# Patient Record
Sex: Male | Born: 1980
Health system: Southern US, Community
[De-identification: ages and names within clinical notes are randomized; demographics above are authoritative.]

## PROBLEM LIST (undated history)

## (undated) DIAGNOSIS — T7840XA Allergy, unspecified, initial encounter: Secondary | ICD-10-CM

## (undated) HISTORY — DX: Allergy, unspecified, initial encounter: T78.40XA

## (undated) HISTORY — PX: WISDOM TOOTH EXTRACTION: SHX21

---

## 2004-11-13 HISTORY — PX: POPLITEAL SYNOVIAL CYST EXCISION: SUR555

## 2006-08-28 ENCOUNTER — Emergency Department (HOSPITAL_COMMUNITY): Admission: EM | Admit: 2006-08-28 | Discharge: 2006-08-28 | Payer: Self-pay | Admitting: Emergency Medicine

## 2007-08-11 IMAGING — CR DG ABDOMEN 1V
2 series · 2 of 2 positions shown · non-contrast
Comparison: none

CLINICAL DATA: Abdomen and back pain.  Nausea and vomiting.  Constipation. 
 ABDOMEN ? 1 VIEW (2 IMAGES):

[view not recorded (1 of 2)]
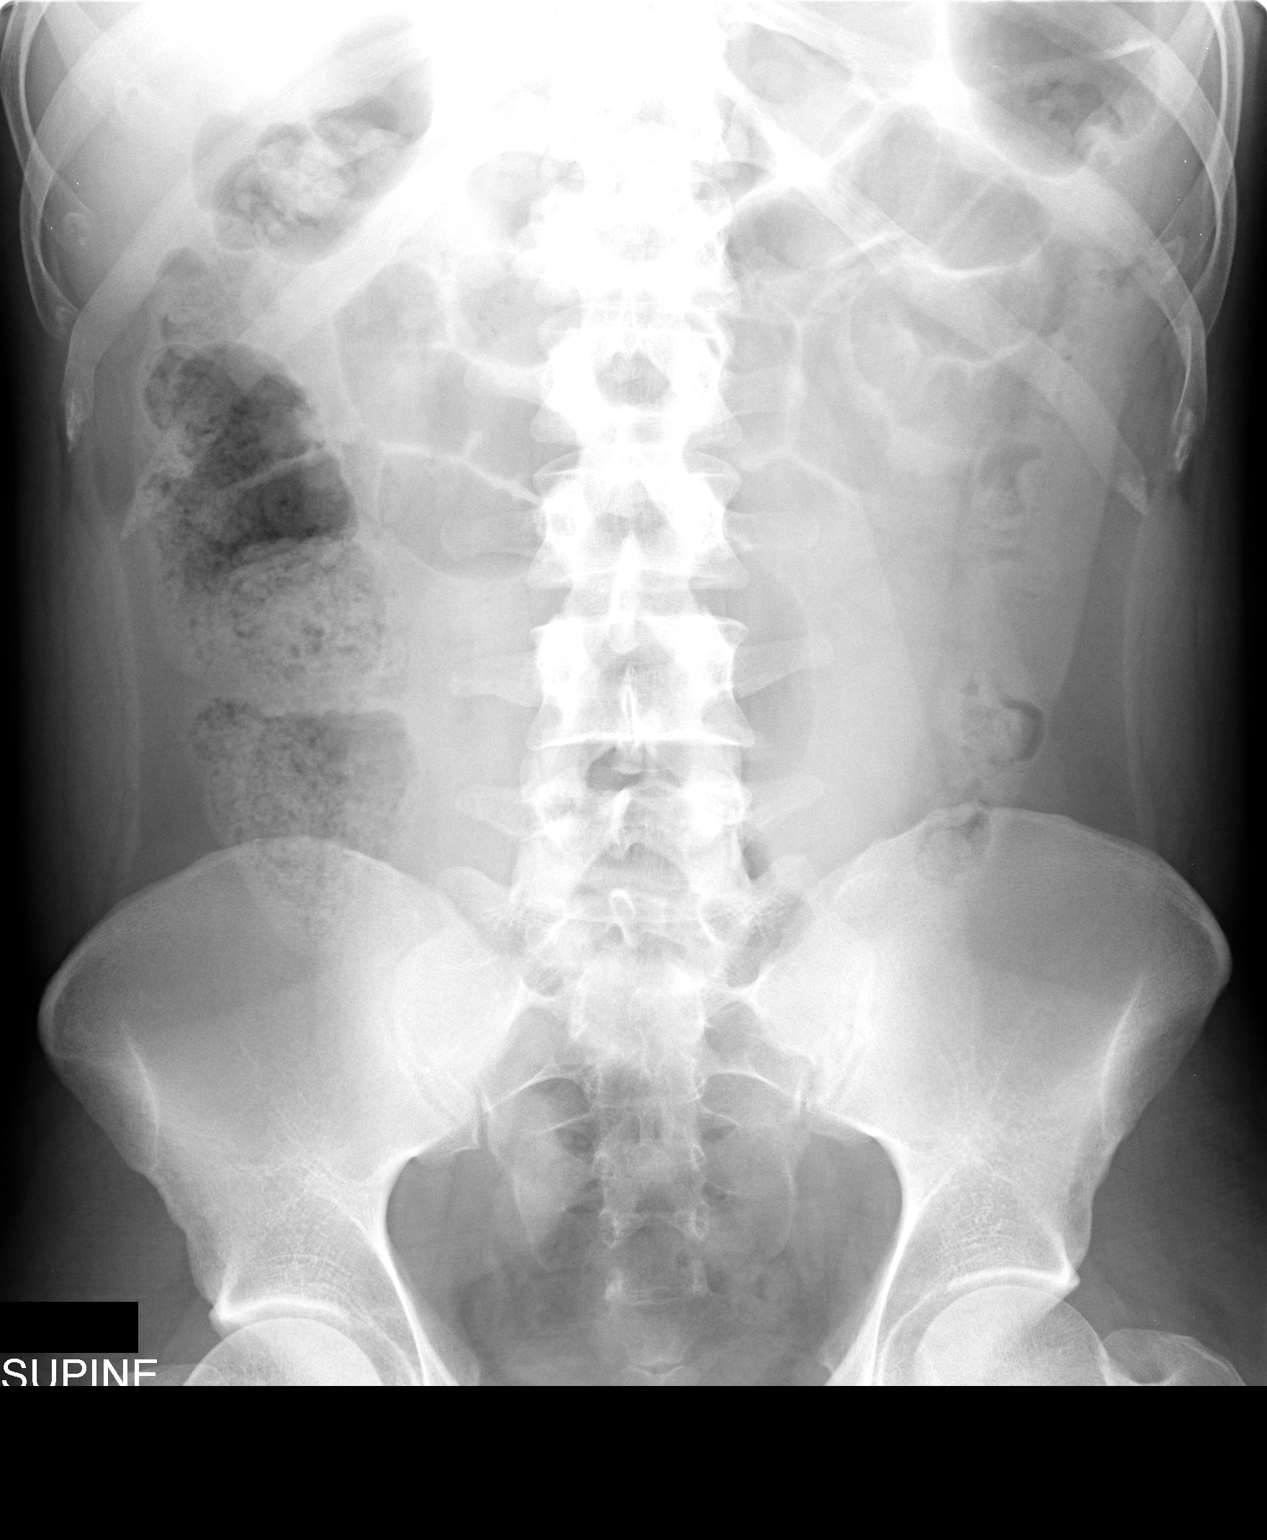

[view not recorded (2 of 2)]
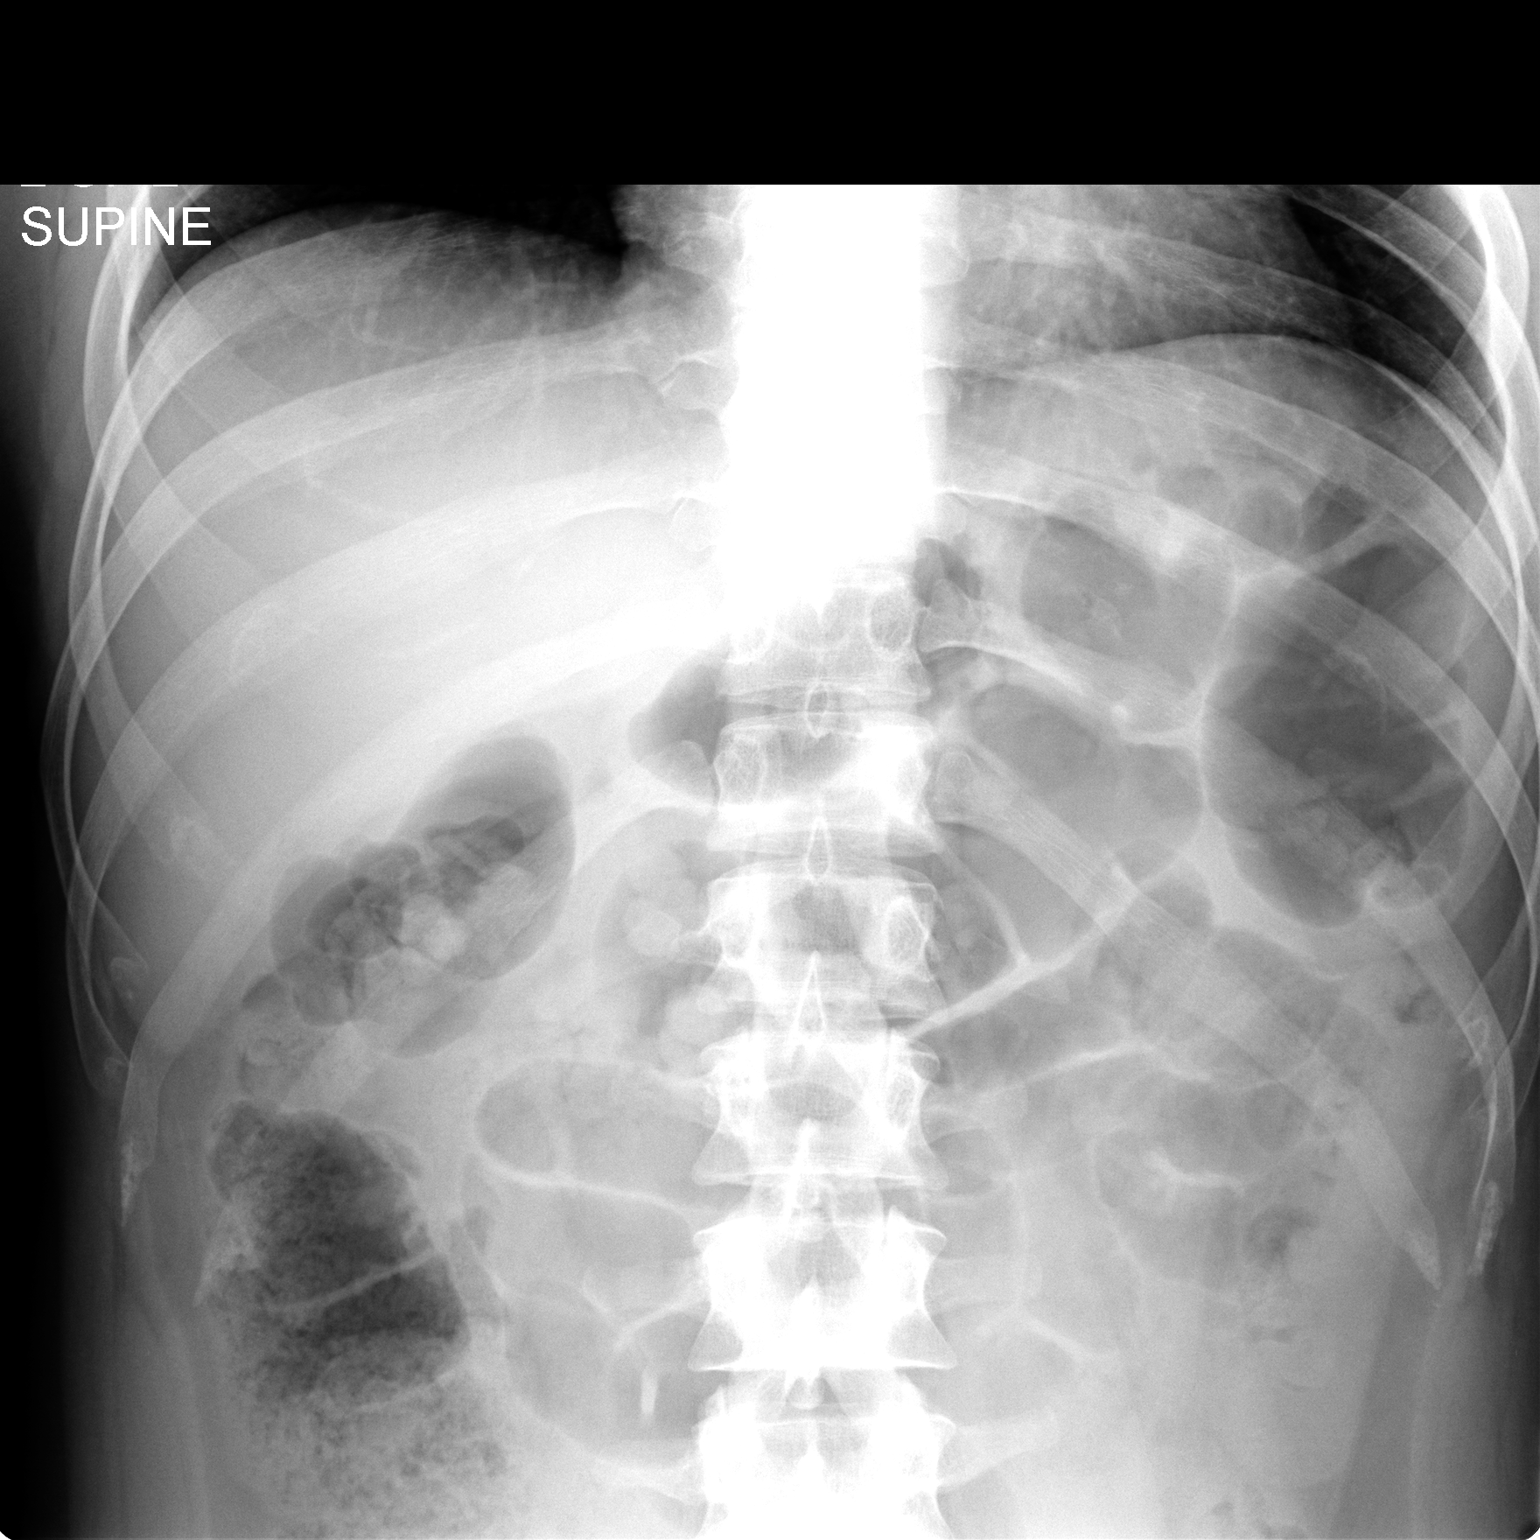

[2 of 2 positions shown; findings below may reference images not displayed]

FINDINGS: No free air.  There is no evidence of bowel obstruction with a moderate volume of stool present in the right colon.   No unexpected abdominal calcifications.
IMPRESSION: Moderate right colonic constipation.  Otherwise negative.

## 2020-07-21 ENCOUNTER — Other Ambulatory Visit: Payer: Self-pay

## 2020-07-21 ENCOUNTER — Encounter: Payer: Self-pay | Admitting: Physician Assistant

## 2020-07-21 ENCOUNTER — Ambulatory Visit (INDEPENDENT_AMBULATORY_CARE_PROVIDER_SITE_OTHER): Payer: 59 | Admitting: Physician Assistant

## 2020-07-21 VITALS — BP 120/86 | HR 86 | Temp 98.8°F | Ht 67.0 in | Wt 242.0 lb

## 2020-07-21 DIAGNOSIS — Z803 Family history of malignant neoplasm of breast: Secondary | ICD-10-CM

## 2020-07-21 DIAGNOSIS — K625 Hemorrhage of anus and rectum: Secondary | ICD-10-CM | POA: Diagnosis not present

## 2020-07-21 DIAGNOSIS — R41840 Attention and concentration deficit: Secondary | ICD-10-CM | POA: Diagnosis not present

## 2020-07-21 DIAGNOSIS — Z800 Family history of malignant neoplasm of digestive organs: Secondary | ICD-10-CM | POA: Diagnosis not present

## 2020-07-21 NOTE — Progress Notes (Signed)
David Meadows is a 39 y.o. male is here to establish care.  I acted as a Education administrator for [employer], PA-C Anselmo Pickler, LPN   History of Present Illness:   Chief Complaint  Patient presents with  . Establish Care  . ADHD  . Hemorrhoids  . Shoulder Pain    HPI   Pt is here today to establish care.  Pancreatic cancer and breast cancer family history Father with history of pancreatic cancer. Paternal grandmother with history of breast cancer. He would like cancer screening.  Hemorrhoids Pt is c/o hemorrhoids off and on for years. Spotting off and on, using preparation H wipes and cream. Has not noticed any blood in his stool. Blood is bright red when wiping. Has itching. Does have history of constipation, but denies any recently. Goes about 1-2 times a day. Will get "gas bubbles" 1-2 times per day (but not daily) and was in the lower abdominal area. Most recently happened a few weeks ago.  ADHD Pt was diagnosed as a child (elementary school aged -- possibly first and second grade) and was never on medication.  Was recommended to start medications but his mom was against this and he was therefore never put on medication. Pt would like to discuss at this time due to having trouble focusing, concentrating. He reports that working at home has caused him to realize that he is having issues with ADHD. Denies issues with anxiety/depression.   Health Maintenance Due  Topic Date Due  . Hepatitis C Screening  Never done  . HIV Screening  Never done  . TETANUS/TDAP  Never done  . INFLUENZA VACCINE  Never done    Past Medical History:  Diagnosis Date  . Allergy      Social History   Tobacco Use  . Smoking status: Former Smoker    Types: Cigarettes  . Smokeless tobacco: Never Used  . Tobacco comment: quit 2013  Vaping Use  . Vaping Use: Never used  Substance Use Topics  . Alcohol use: Yes    Comment: 1-2 per month  . Drug use: Never    Past Surgical History:    Procedure Laterality Date  . WISDOM TOOTH EXTRACTION      Family History  Problem Relation Age of Onset  . Other Mother        heart arrythmia  . Alcohol abuse Father   . Pancreatic cancer Father   . Heart murmur Father   . Drug abuse Sister   . Cataracts Maternal Grandmother   . Memory loss Maternal Grandmother   . Breast cancer Paternal Grandmother   . ADD / ADHD Daughter   . Colon polyps Paternal Uncle   . Colon cancer Neg Hx     PMHx, SurgHx, SocialHx, FamHx, Medications, and Allergies were reviewed in the Visit Navigator and updated as appropriate.   There are no problems to display for this patient.   Social History   Tobacco Use  . Smoking status: Former Smoker    Types: Cigarettes  . Smokeless tobacco: Never Used  . Tobacco comment: quit 2013  Vaping Use  . Vaping Use: Never used  Substance Use Topics  . Alcohol use: Yes    Comment: 1-2 per month  . Drug use: Never    Current Medications and Allergies:    Current Outpatient Medications:  .  ibuprofen (ADVIL) 200 MG tablet, Take 600 mg by mouth as needed., Disp: , Rfl:  .  loratadine (CLARITIN) 10 MG tablet,  Take 10 mg by mouth daily., Disp: , Rfl:  .  OVER THE COUNTER MEDICATION, Take 2 capsules by mouth daily. [place] Mega Men Energy & Metabolism, Disp: , Rfl:    Allergies  Allergen Reactions  . Other     Mild wheat allergy per patient    Review of Systems   ROS  Negative unless otherwise specified per HPI.  Vitals:   Vitals:   07/21/20 1145  BP: 120/86  Pulse: 86  Temp: 98.8 F (37.1 C)  TempSrc: Temporal  SpO2: 97%  Weight: 242 lb (109.8 kg)  Height: 5\' 7"  (1.702 m)     Body mass index is 37.9 kg/m.   Physical Exam:    Physical Exam Vitals and nursing note reviewed.  Constitutional:      General: He is not in acute distress.    Appearance: He is well-developed. He is not ill-appearing or toxic-appearing.  Cardiovascular:     Rate and Rhythm: Normal rate and regular rhythm.      Pulses: Normal pulses.     Heart sounds: Normal heart sounds, S1 normal and S2 normal.     Comments: No LE edema Pulmonary:     Effort: Pulmonary effort is normal.     Breath sounds: Normal breath sounds.  Abdominal:     General: Abdomen is flat. Bowel sounds are normal.     Palpations: Abdomen is soft.     Tenderness: There is no abdominal tenderness.  Skin:    General: Skin is warm and dry.  Neurological:     Mental Status: He is alert.     GCS: GCS eye subscore is 4. GCS verbal subscore is 5. GCS motor subscore is 6.  Psychiatric:        Speech: Speech normal.        Behavior: Behavior normal. Behavior is cooperative.      Assessment and Plan:    Quinlan was seen today for establish care, adhd, hemorrhoids and shoulder pain.  Diagnoses and all orders for this visit:  Family history of breast cancer; Family history of pancreatic cancer Referral to genetics counselor.  Rectal bleeding No red flags on exam. Offered anusol suppositories however he declined. Referral to GI placed today.  Attention deficit Referral to [facility].   Recommended returning for CPE at his convenience.   CMA or LPN served as scribe during this visit. History, Physical, and Plan performed by medical provider. The above documentation has been reviewed and is accurate and complete.  Inda Coke, PA-C [place] [place] 07/21/2020  Follow-up: No follow-ups on file.

## 2020-07-21 NOTE — Addendum Note (Signed)
Addended by: Erlene Quan on: 07/21/2020 12:47 PM   Modules accepted: Orders

## 2020-07-21 NOTE — Patient Instructions (Signed)
It was great to see you!  You will be contacted about your referrals to: [facility] [place] [facility]  Let's follow-up at your convenience for a physical, sooner if you have concerns.  Take care,  Inda Coke PA-C

## 2020-07-22 ENCOUNTER — Telehealth: Payer: Self-pay | Admitting: Genetic Counselor

## 2020-07-22 NOTE — Telephone Encounter (Signed)
Received a genetic counseling referral from David Meadows, [place] for fhx of breast and pancreatic cancer. David Meadows has been cld and scheduled to see Cari on 10/4 at 11am. Pt aware to arrive 15 minutes early.

## 2020-08-16 ENCOUNTER — Inpatient Hospital Stay: Payer: 59

## 2020-08-16 ENCOUNTER — Inpatient Hospital Stay: Payer: 59 | Attending: Genetic Counselor | Admitting: Genetic Counselor

## 2020-08-16 ENCOUNTER — Other Ambulatory Visit: Payer: Self-pay

## 2020-08-16 DIAGNOSIS — Z800 Family history of malignant neoplasm of digestive organs: Secondary | ICD-10-CM | POA: Diagnosis not present

## 2020-08-16 DIAGNOSIS — Z8371 Family history of colonic polyps: Secondary | ICD-10-CM | POA: Diagnosis not present

## 2020-08-16 DIAGNOSIS — Z803 Family history of malignant neoplasm of breast: Secondary | ICD-10-CM

## 2020-08-17 ENCOUNTER — Encounter: Payer: Self-pay | Admitting: Genetic Counselor

## 2020-08-17 DIAGNOSIS — Z800 Family history of malignant neoplasm of digestive organs: Secondary | ICD-10-CM | POA: Insufficient documentation

## 2020-08-17 DIAGNOSIS — Z8371 Family history of colonic polyps: Secondary | ICD-10-CM

## 2020-08-17 DIAGNOSIS — Z83719 Family history of colon polyps, unspecified: Secondary | ICD-10-CM

## 2020-08-17 DIAGNOSIS — Z803 Family history of malignant neoplasm of breast: Secondary | ICD-10-CM

## 2020-08-17 HISTORY — DX: Family history of colonic polyps: Z83.71

## 2020-08-17 HISTORY — DX: Family history of colon polyps, unspecified: Z83.719

## 2020-08-17 HISTORY — DX: Family history of malignant neoplasm of breast: Z80.3

## 2020-08-17 NOTE — Progress Notes (Signed)
EFERRING PROVIDER: Inda Coke, [address] [place],  [place] [address]  PRIMARY PROVIDER:  Inda Coke, [place]  PRIMARY REASON FOR VISIT:  1. Family history of colon cancer   2. Family history of breast cancer    HISTORY OF PRESENT ILLNESS:   Mr. Murrell, a 39 y.o. male, was seen for a [facility] cancer genetics consultation at the request of Dr. Morene Rankins due to a family history of signet ring adenocarcinoma and breast cancer.  Mr. Spraggins presents to clinic today to discuss the possibility of a hereditary predisposition to cancer, to discuss genetic testing, and to further clarify his future cancer risks, as well as potential cancer risks for family members.   Mr. Eisenbeis is a 39 y.o. male with no personal history of cancer.    RISK FACTORS:  Colonoscopy: no; not examined.  Prostate cancer screening: none reported   Past Medical History:  Diagnosis Date  . Allergy   . Family history of breast cancer 08/17/2020    Past Surgical History:  Procedure Laterality Date  . WISDOM TOOTH EXTRACTION      Social History   Socioeconomic History  . Marital status: Married    Spouse name: Not on file  . Number of children: Not on file  . Years of education: Not on file  . Highest education level: Not on file  Occupational History  . Not on file  Tobacco Use  . Smoking status: Former Smoker    Types: Cigarettes  . Smokeless tobacco: Never Used  . Tobacco comment: quit 2013  Vaping Use  . Vaping Use: Never used  Substance and Sexual Activity  . Alcohol use: Yes    Comment: 1-2 per month  . Drug use: Never  . Sexual activity: Yes  Other Topics Concern  . Not on file  Social History Narrative   Works from home   Was in [employer] with girlfriend   Daughter is 74 yo (2021)   From [employer]   Social Determinants of Health   Financial Resource Strain:   . Difficulty of Paying Living Expenses: Not on file  Food Insecurity:   . Worried About Charity fundraiser  in the Last Year: Not on file  . Ran Out of Food in the Last Year: Not on file  Transportation Needs:   . Lack of Transportation (Medical): Not on file  . Lack of Transportation (Non-Medical): Not on file  Physical Activity:   . Days of Exercise per Week: Not on file  . Minutes of Exercise per Session: Not on file  Stress:   . Feeling of Stress : Not on file  Social Connections:   . Frequency of Communication with Friends and Family: Not on file  . Frequency of Social Gatherings with Friends and Family: Not on file  . Attends Religious Services: Not on file  . Active Member of Clubs or Organizations: Not on file  . Attends Archivist Meetings: Not on file  . Marital Status: Not on file     FAMILY HISTORY:  We obtained a detailed, 4-generation family history.  Significant diagnoses are listed below: Family History  Problem Relation Age of Onset  . Cancer Father        signet ring adenocarcinoma, unknown primary (colon? appendix?), dx mid 75s  . Breast cancer Paternal Grandmother        dx after 74  . Colon polyps Paternal Uncle       Mr.  Barret has one brother, age 85, and a paternal half sister in her 24s. Neither of his siblings have a cancer history.  Mr. Spieler mother is 48 years old.  No maternal family history of cancer was reported.  Mr. Kneisel father was diagnosed in his mid 86s with signet ring adenocarcinoma of unknown primary.  Mr. Venn mentioned that this was likely colon or appendix cancer.  Mr. Schaumburg paternal uncle was found to have have multiple colon polyps; however, Mr. Costabile could not recall the number of polyps.  Mr. Adamcik paternal grandmother was diagnosed with breast cancer after the age of 19 and passed away in her 87s or 28s.  No other paternal family history of cancer was reported.   Mr. Lipari is unaware of previous family history of genetic testing for hereditary cancer risks. Patient's maternal ancestors are of [place] and Native  American descent, and paternal ancestors are of [place] and [place] descent. There is no reported Ashkenazi Jewish ancestry. There is no known consanguinity.  GENETIC COUNSELING ASSESSMENT: Mr. Parmer is a 39 y.o. male with a family history of cancer which is somewhat suggestive of a hereditary cancer syndrome and predisposition to cancer given the age at which his father was diagnosed, the signet ring pathology of his father's cancer, and the presence of related cancers in the family. We, therefore, discussed and recommended the following at today's visit.   DISCUSSION: We discussed that 5 - 10% of cancer is hereditary. We discussed that cancer of the digestive system with signet ring pathology can be associated with mutations in CDH1, which is also associated with lobular breast cancer.  There are also several hereditary cancer syndromes associated with colon cancer before the age of 51, including but not limited to Lynch syndrome. We discussed that testing is beneficial for several reasons, including knowing about other cancer risks, identifying potential screening and risk-reduction options that may be appropriate, and understanding if other family members could be at risk for cancer and allowing them to undergo genetic testing.  We reviewed the characteristics, features and inheritance patterns of hereditary cancer syndromes. We also discussed genetic testing, including the appropriate family members to test, the process of testing, insurance coverage and turn-around-time for results. We discussed the implications of a negative, positive, carrier and/or variant of uncertain significant result. We discussed that negative results would be uninformative given that Mr. Grigorian does not have a personal history of cancer. We recommended Mr. Clubb pursue genetic testing for a panel that contains genes associated with cancers of the digestive system and breast cancer.  Mr. Fournet was offered a common hereditary  cancer panel (48 genes) and an expanded pan-cancer panel (85 genes). Mr. Louque was informed of the benefits and limitations of each panel, including that expanded pan-cancer panels contain several preliminary evidence genes that do not have clear management guidelines at this point in time.  We also discussed that as the number of genes included on a panel increases, the chances of variants of uncertain significance increases.  After considering the benefits and limitations of each gene panel, Mr. Pankow elected to have an expanded pan-cancer panel through [facility].  The Multi-Cancer Panel offered by [facility] includes sequencing and/or deletion duplication testing of the following 85 genes: AIP, ALK, APC, ATM, AXIN2,BAP1,  BARD1, BLM, BMPR1A, BRCA1, BRCA2, BRIP1, CASR, CDC73, CDH1, CDK4, CDKN1B, CDKN1C, CDKN2A (p14ARF), CDKN2A (p16INK4a), CEBPA, CHEK2, CTNNA1, DICER1, DIS3L2, EGFR (c.2369C>T, p.Thr790Met variant only), EPCAM (Deletion/duplication testing only), FH, FLCN, GATA2, GPC3, GREM1 (Promoter region  deletion/duplication testing only), HOXB13 (c.251G>A, p.Gly84Glu), HRAS, KIT, MAX, MEN1, MET, MITF (c.952G>A, p.Glu318Lys variant only), MLH1, MSH2, MSH3, MSH6, MUTYH, NBN, NF1, NF2, NTHL1, PALB2, PDGFRA, PHOX2B, PMS2, POLD1, POLE, POT1, PRKAR1A, PTCH1, PTEN, RAD50, RAD51C, RAD51D, RB1, RECQL4, RET, RNF43, RUNX1, SDHAF2, SDHA (sequence changes only), SDHB, SDHC, SDHD, SMAD4, SMARCA4, SMARCB1, SMARCE1, STK11, SUFU, TERC, TERT, TMEM127, TP53, TSC1, TSC2, VHL, WRN and WT1.    Based on Mr. Iracheta's family history of cancer, he meets medical criteria for genetic testing. Despite that he meets criteria, he may still have an out of pocket cost. We discussed that if his out of pocket cost for testing is over $100, the laboratory will call and confirm whether he wants to proceed with testing.  If the out of pocket cost of testing is less than $100 he will be billed by the genetic testing laboratory.   We discussed  that some people do not want to undergo genetic testing due to fear of genetic discrimination.  A federal law called the Genetic Information Non-Discrimination Act (GINA) of 2008 helps protect individuals against genetic discrimination based on their genetic test results.  It impacts both health insurance and employment.  With health insurance, it protects against increased premiums, being kicked off insurance or being forced to take a test in order to be insured.  For employment it protects against hiring, firing and promoting decisions based on genetic test results.  GINA does not apply to those in the [employer], those who work for companies with less than 15 employees, and new life insurance or long-term disability insurance policies.  Health status due to a cancer diagnosis is not protected under GINA.  PLAN: After considering the risks, benefits, and limitations, Mr. Bunte provided informed consent to pursue genetic testing and the blood sample was sent to [facility] for analysis of the Multi-Cancer Panel. Results should be available within approximately 3 weeks' time, at which point they will be disclosed by telephone to Mr. Bergen, as will any additional recommendations warranted by these results. Mr. Stryker will receive a summary of his genetic counseling visit and a copy of his results once available. This information will also be available in Epic.   Lastly, we encouraged Mr. Resende to remain in contact with cancer genetics annually so that we can continuously update the family history and inform him of any changes in cancer genetics and testing that may be of benefit for this family.   Mr. Kray questions were answered to his satisfaction today. Our contact information was provided should additional questions or concerns arise. Thank you for the referral and allowing [place] to share in the care of your patient.   Imelda Dandridge M. Joette Catching, [place], [facility] Certified [email or web address] (P) [phone]   The patient was seen for a total of 35 minutes in face-to-face genetic counseling.  This patient was discussed with Drs. Magrinat, Lindi Adie and/or Burr Medico who agrees with the above.    _______________________________________________________________________ For Office Staff:  Number of people involved in session: 1 Was an Intern/ student involved with case: no

## 2020-08-25 ENCOUNTER — Encounter: Payer: 59 | Admitting: Physician Assistant

## 2020-08-25 ENCOUNTER — Telehealth: Payer: Self-pay | Admitting: Genetic Counselor

## 2020-08-25 NOTE — Telephone Encounter (Signed)
Revealed negative genetic testing.  Discussed that we do not know why there is cancer in the family. It could be sporadic, due to a mutation that he did not inherit, or due to a different gene that we are not testing.  It will be important for him to keep in contact with genetics to keep up with whether additional testing may be needed.

## 2020-08-30 ENCOUNTER — Ambulatory Visit: Payer: 59 | Admitting: Physician Assistant

## 2020-08-30 ENCOUNTER — Encounter: Payer: Self-pay | Admitting: Physician Assistant

## 2020-08-30 ENCOUNTER — Encounter: Payer: Self-pay | Admitting: Genetic Counselor

## 2020-08-30 ENCOUNTER — Ambulatory Visit: Payer: Self-pay | Admitting: Genetic Counselor

## 2020-08-30 VITALS — BP 114/84 | HR 84 | Ht 67.0 in | Wt 239.8 lb

## 2020-08-30 DIAGNOSIS — Z1379 Encounter for other screening for genetic and chromosomal anomalies: Secondary | ICD-10-CM

## 2020-08-30 DIAGNOSIS — Z8371 Family history of colonic polyps: Secondary | ICD-10-CM

## 2020-08-30 DIAGNOSIS — Z800 Family history of malignant neoplasm of digestive organs: Secondary | ICD-10-CM

## 2020-08-30 DIAGNOSIS — K625 Hemorrhage of anus and rectum: Secondary | ICD-10-CM

## 2020-08-30 DIAGNOSIS — Z803 Family history of malignant neoplasm of breast: Secondary | ICD-10-CM

## 2020-08-30 MED ORDER — NA SULFATE-K SULFATE-MG SULF 17.5-3.13-1.6 GM/177ML PO SOLN: 1.0000 | Freq: Once | ORAL | 0 refills | Status: AC

## 2020-08-30 NOTE — Patient Instructions (Signed)
If you are age 37 or older, your body mass index should be between 23-30. Your Body mass index is 37.56 kg/m. If this is out of the aforementioned range listed, please consider follow up with your Primary Care Provider.  If you are age 39 or younger, your body mass index should be between 19-25. Your Body mass index is 37.56 kg/m. If this is out of the aformentioned range listed, please consider follow up with your Primary Care Provider.   You have been scheduled for a colonoscopy. Please follow written instructions given to you at your visit today.  Please pick up your prep supplies at the pharmacy within the next 1-3 days. If you use inhalers (even only as needed), please bring them with you on the day of your procedure.  Follow up pending on the results of your Colonoscopy.

## 2020-08-30 NOTE — Progress Notes (Signed)
Subjective:    Patient ID: David Meadows, male    DOB: Aug 19, 1981, 39 y.o.   MRN: [id number]  HPI Sion is a pleasant 39 year old white male, new to GI today referred by Inda Coke, PA-C for colon cancer screening. Patient's father had history of a signet ring adenocarcinoma diagnosed at age 7, apparently was not clear whether this tumor originated from the cecum or the appendix.  He also has family history of breast cancer.  He believes that his paternal uncle also had colon polyps. Patient underwent genetic counseling recently due to concerns for a hereditary cancer syndrome He had a multicancer panel done and results were negative. Patient otherwise in generally good health, with history of ADHD.  He relates that he has had intermittent small-volume rectal bleeding for several years which she has attributed to hemorrhoids.  This is generally associated with some itching and burning.  He may have symptoms less than once per month.  He has noticed spotting of blood on the tissue very sporadically.  He has used Preparation H and witch hazel as needed. he has not had any changes in his bowel habits, says occasionally he will have more frequent stools and occasionally has some abdominal cramping or gas. No chronic GERD symptoms, no dysphagia or odynophagia.  Review of Systems Pertinent positive and negative review of systems were noted in the above HPI section.  All other review of systems was otherwise negative.  Outpatient Encounter Medications as of 08/30/2020  Medication Sig  . ibuprofen (ADVIL) 200 MG tablet Take 600 mg by mouth as needed.  . loratadine (CLARITIN) 10 MG tablet Take 10 mg by mouth daily.  Mar[place] Kitchen OVER THE COUNTER MEDICATION Take 2 capsules by mouth daily. [place] Mega Men Energy & Metabolism  . Na Sulfate-K Sulfate-Mg Sulf 17.5-3.13-1.6 GM/177ML SOLN Take 1 kit by mouth once for 1 dose. Apply Coupon=BIN: [id number] PCN: CN GROUP: [id number] ID: [id number]   No  facility-administered encounter medications on file as of 08/30/2020.   Allergies  Allergen Reactions  . Other     Mild wheat allergy per patient   Patient Active Problem List   Diagnosis Date Noted  . Genetic testing 08/30/2020  . Family history of colon cancer 08/17/2020  . Family history of breast cancer 08/17/2020  . Family history of colonic polyps 08/17/2020   Social History   Socioeconomic History  . Marital status: Married    Spouse name: Not on file  . Number of children: Not on file  . Years of education: Not on file  . Highest education level: Not on file  Occupational History  . Not on file  Tobacco Use  . Smoking status: Former Smoker    Packs/day: 1.50    Years: 2.00    Pack years: 3.00    Types: Cigarettes    Quit date: 12/2011    Years since quitting: 8.7  . Smokeless tobacco: Never Used  Vaping Use  . Vaping Use: Never used  Substance and Sexual Activity  . Alcohol use: Yes    Comment: rare  . Drug use: Never  . Sexual activity: Yes  Other Topics Concern  . Not on file  Social History Narrative   Works from home   Was in [employer] with girlfriend   Daughter is 40 yo (2021)   From [employer]   Social Determinants of Health   Financial Resource Strain:   . Difficulty of Paying Living Expenses: Not on file  Food Insecurity:   .  Worried About Charity fundraiser in the Last Year: Not on file  . Ran Out of Food in the Last Year: Not on file  Transportation Needs:   . Lack of Transportation (Medical): Not on file  . Lack of Transportation (Non-Medical): Not on file  Physical Activity:   . Days of Exercise per Week: Not on file  . Minutes of Exercise per Session: Not on file  Stress:   . Feeling of Stress : Not on file  Social Connections:   . Frequency of Communication with Friends and Family: Not on file  . Frequency of Social Gatherings with Friends and Family: Not on file  . Attends Religious Services: Not on file  . Active Member  of Clubs or Organizations: Not on file  . Attends Archivist Meetings: Not on file  . Marital Status: Not on file  Intimate Partner Violence:   . Fear of Current or Ex-Partner: Not on file  . Emotionally Abused: Not on file  . Physically Abused: Not on file  . Sexually Abused: Not on file    Mr. Huhn's family history includes ADD / ADHD in his daughter; Alcohol abuse in his father; Breast cancer in his paternal grandmother; Cancer in his father; Cataracts in his maternal grandmother; Colon polyps in his paternal uncle; Drug abuse in his sister; Heart murmur in his father; Memory loss in his maternal grandmother; Other in his mother.      Objective:    Vitals:   08/30/20 1333  BP: 114/84  Pulse: 84  SpO2: 99%    Physical Exam Well-developed well-nourished WM in no acute distress.  Height, Weight,239 BMI 37.5  HEENT; nontraumatic normocephalic, EOMI, PE R LA, sclera anicteric. Oropharynx;not done Neck; supple, no JVD Cardiovascular; regular rate and rhythm with S1-S2, no murmur rub or gallop Pulmonary; Clear bilaterally Abdomen; soft, nontender, nondistended, no palpable mass or hepatosplenomegaly, bowel sounds are active Rectal;not done today Skin; benign exam, no jaundice rash or appreciable lesions Extremities; no clubbing cyanosis or edema skin warm and dry Neuro/Psych; alert and oriented x4, grossly nonfocal mood and affect appropriate       Assessment & Plan:   #4 39 year old white male with positive family history of signet cell adenocarcinoma of the cecum versus appendix in patient's father diagnosed age 4 (deceased age 28). Recent genetic counseling with multi cancer panel negative.  #2 intermittent low-grade hematochezia over the past few years with intermittent itching and burning.  Likely secondary to hemorrhoids  #3 obesity 4.  ADHD  Plan; Patient will be scheduled for colonoscopy with Dr. Bryan Lemma.  Procedure was discussed in detail with  patient including indications risks and benefits and he is agreeable to proceed. Given family history patient should undergo surveillance every 5 years. Patient has completed COVID-19 vaccination.  Kinsley Holderman S Earleen Aoun PA-C 08/30/2020   Cc: Inda Coke, [place]

## 2020-08-30 NOTE — Progress Notes (Signed)
HPI:  Mr. Popov was previously seen in the [facility] due to a family history of signet ring adenocarcinoma of unknown primary, family history of breast cancer, family history of colon polyps, and concerns regarding a hereditary predisposition to cancer. Please refer to our prior cancer genetics clinic note for more information regarding our discussion, assessment and recommendations, at the time. Mr. Salminen recent genetic test results were disclosed to him, as were recommendations warranted by these results. These results and recommendations are discussed in more detail below.  CANCER HISTORY:  Ms. Stailey is a 39 year old male without a history of cancer.   FAMILY HISTORY:  We obtained a detailed, 4-generation family history.  Significant diagnoses are listed below: Family History  Problem Relation Age of Onset   Cancer Father        signet ring adenocarcinoma, unknown primary (colon? appendix?), dx mid 53s   Breast cancer Paternal Grandmother        dx after 32   Colon polyps Paternal Uncle       Mr. Lepak has one brother, age 86, and a paternal half sister in her 76s. Neither of his siblings have a cancer history.  Mr. Laboy mother is 27 years old.  No maternal family history of cancer was reported.  Mr. Fendley father was diagnosed in his mid 26s with signet ring adenocarcinoma of unknown primary.  Mr. Clouse mentioned that this was likely colon or appendix cancer.  Mr. Cruise paternal uncle was found to have have multiple colon polyps; however, Mr. Primo could not recall the number of polyps.  Mr. Newey paternal grandmother was diagnosed with breast cancer after the age of 39 and passed away in her 78s or 54s.  No other paternal family history of cancer was reported.   Mr. Nishikawa is unaware of previous family history of genetic testing for hereditary cancer risks. Patient's maternal ancestors are of Judeth [place] and Native American descent, and paternal  ancestors are of [place] and [place] descent. There is no reported Ashkenazi Jewish ancestry. There is no known consanguinity.  GENETIC TEST RESULTS: Genetic testing reported out on August 24, 2020.  The Multi-Cancer Panel through [facility] found no pathogenic mutations.  The Multi-Cancer Panel offered by [facility] includes sequencing and/or deletion duplication testing of the following 85 genes: AIP, ALK, APC, ATM, AXIN2,BAP1,  BARD1, BLM, BMPR1A, BRCA1, BRCA2, BRIP1, CASR, CDC73, CDH1, CDK4, CDKN1B, CDKN1C, CDKN2A (p14ARF), CDKN2A (p16INK4a), CEBPA, CHEK2, CTNNA1, DICER1, DIS3L2, EGFR (c.2369C>T, p.Thr790Met variant only), EPCAM (Deletion/duplication testing only), FH, FLCN, GATA2, GPC3, GREM1 (Promoter region deletion/duplication testing only), HOXB13 (c.251G>A, p.Gly84Glu), HRAS, KIT, MAX, MEN1, MET, MITF (c.952G>A, p.Glu318Lys variant only), MLH1, MSH2, MSH3, MSH6, MUTYH, NBN, NF1, NF2, NTHL1, PALB2, PDGFRA, PHOX2B, PMS2, POLD1, POLE, POT1, PRKAR1A, PTCH1, PTEN, RAD50, RAD51C, RAD51D, RB1, RECQL4, RET, RNF43, RUNX1, SDHAF2, SDHA (sequence changes only), SDHB, SDHC, SDHD, SMAD4, SMARCA4, SMARCB1, SMARCE1, STK11, SUFU, TERC, TERT, TMEM127, TP53, TSC1, TSC2, VHL, WRN and WT1.    The test report has been scanned into EPIC and is located under the Molecular Pathology section of the Results Review tab.  A portion of the result report is included below for reference.     We discussed with Mr. Husain that because current genetic testing is not perfect, it is possible there may be a gene mutation in one of these genes that current testing cannot detect, but that chance is small.  We also discussed, that there could be another gene that has not yet been  discovered, or that we have not yet tested, that is responsible for the cancer diagnoses in the family. It is also possible there is a hereditary cause for the cancer in the family that Mr. Gaspari did not inherit and therefore was not identified in his testing.   Therefore, it is important to remain in touch with cancer genetics in the future so that we can continue to offer Mr. Bovee the most up to date genetic testing.   ADDITIONAL GENETIC TESTING: We discussed with Mr. Harkless that his genetic testing was fairly extensive.  If there are genes identified to increase cancer risk that can be analyzed in the future, we would be happy to discuss and coordinate this testing at that time.    CANCER SCREENING RECOMMENDATIONS: Mr. Lynch test result is considered negative (normal).  This means that we have not identified a hereditary cause for his family history of cancer at this time. Most cancers happen by chance and this negative test suggests that his family's cancer may fall into this category.    While reassuring, this does not definitively rule out a hereditary predisposition to cancer. It is still possible that there could be genetic mutations that are undetectable by current technology. There could be genetic mutations in genes that have not been tested or identified to increase cancer risk.  Therefore, it is recommended he continue to follow the cancer management and screening guidelines provided by his primary healthcare provider.   An individual's cancer risk and medical management are not determined by genetic test results alone. Overall cancer risk assessment incorporates additional factors, including personal medical history, family history, and any available genetic information that may result in a personalized plan for cancer prevention and surveillance.   Mr. Ervine had limited information about the primary site of his father's cancer and stated that it may have been colon.  First degree relatives of those with colon cancer should receive colonoscopies beginning at age 37, or 10 years prior to the earliest diagnosis of colon cancer in the family, and receive colonoscopies at least every 5 years, or as recommended by their gastroenterologist.      RECOMMENDATIONS FOR FAMILY MEMBERS:  Individuals in this family might be at some increased risk of developing cancer, over the general population risk, simply due to the family history of cancer.  We recommended women in this family have a yearly mammogram beginning at age 8, or 53 years younger than the earliest onset of cancer, an annual clinical breast exam, and perform monthly breast self-exams. Women in this family should also have a gynecological exam as recommended by their primary provider. All family members should be referred for colonoscopy starting at age 92 or earlier.  First degree relatives of those with colon cancer should receive colonoscopies beginning at age 52, or 10 years prior to the earliest diagnosis of colon cancer in the family, and receive colonoscopies at least every 5 years, or as recommended by their gastroenterologist.    It is also possible there is a hereditary cause for the cancer in Mr. Gravely's family that he did not inherit and therefore was not identified in him.  Based on Mr. Layne's family history of his father's cancer, we recommended Mr. Mazzie siblings have genetic counseling and testing. Mr. Edelen will let us know if we can be of any assistance in coordinating genetic counseling and/or testing for these family members.   FOLLOW-UP: Lastly, we discussed with Mr. Gubser that cancer genetics is a rapidly advancing  field and it is possible that new genetic tests will be appropriate for him and/or his family members in the future. We encouraged him to remain in contact with cancer genetics on an annual basis so we can update his personal and family histories and let him know of advances in cancer genetics that may benefit this family.   Our contact number was provided. Mr. Nay questions were answered to his satisfaction, and he knows he is welcome to call us at anytime with additional questions or concerns.   Daelan Gatt M. Joette Catching, [place], [facility] Certified [email or web address] (P) [phone]

## 2020-08-31 ENCOUNTER — Encounter: Payer: Self-pay | Admitting: Physician Assistant

## 2020-08-31 ENCOUNTER — Other Ambulatory Visit: Payer: Self-pay

## 2020-08-31 ENCOUNTER — Ambulatory Visit (INDEPENDENT_AMBULATORY_CARE_PROVIDER_SITE_OTHER): Payer: 59 | Admitting: Physician Assistant

## 2020-08-31 VITALS — BP 120/80 | HR 77 | Temp 97.8°F | Ht 67.0 in | Wt 241.0 lb

## 2020-08-31 DIAGNOSIS — Z23 Encounter for immunization: Secondary | ICD-10-CM

## 2020-08-31 DIAGNOSIS — Z0000 Encounter for general adult medical examination without abnormal findings: Secondary | ICD-10-CM

## 2020-08-31 DIAGNOSIS — E669 Obesity, unspecified: Secondary | ICD-10-CM | POA: Diagnosis not present

## 2020-08-31 DIAGNOSIS — Z1159 Encounter for screening for other viral diseases: Secondary | ICD-10-CM | POA: Diagnosis not present

## 2020-08-31 DIAGNOSIS — Z114 Encounter for screening for human immunodeficiency virus [HIV]: Secondary | ICD-10-CM | POA: Diagnosis not present

## 2020-08-31 LAB — CBC WITH DIFFERENTIAL/PLATELET
Basophils Absolute: 60 cells/uL (ref 0–200)
Eosinophils Relative: 7.2 %

## 2020-08-31 NOTE — Progress Notes (Signed)
I acted as a Education administrator for [employer], PA-C Anselmo Pickler, LPN   Subjective:    David Meadows is a 39 y.o. male and is here for a comprehensive physical exam.  HPI  Health Maintenance Due  Topic Date Due  . Hepatitis C Screening  Never done  . TETANUS/TDAP  Never done  . INFLUENZA VACCINE  Never done    Acute Concerns: None  Chronic Issues: None  Health Maintenance: Immunizations -- UTD, will give Flu & Tdap today Colonoscopy -- scheduled on Nov 17th. PSA -- N/A Diet -- "okay"; tries to eat a healthy dinner (eats leftovers for lunch); occasional snacking and unhealthy breakfast; [employer]el; limited sodas and juices Caffeine intake -- energy drink daily Sleep habits -- stays up late (has a side business for 3-D printing) Exercise -- sedentary, does work with a marching band and get physical activity in Weight -- Weight: 241 lb (109.3 kg)  Weight history Wt Readings from Last 10 Encounters:  08/31/20 241 lb (109.3 kg)  08/30/20 239 lb 12.8 oz (108.8 kg)  07/21/20 242 lb (109.8 kg)   Body mass index is 37.75 kg/m. Mood -- no anxiety or depression Tobacco use --    Tobacco Use: Medium Risk  . Smoking Tobacco Use: Former Smoker  . Smokeless Tobacco Use: Never Used    Alcohol use ---  reports current alcohol use.   Depression screen PHQ 2/9 08/31/2020  Decreased Interest 0  Down, Depressed, Hopeless 0  PHQ - 2 Score 0   Overdue for dentist and eye doc  Other providers/specialists: Patient Care Team: Inda Coke, [place] as PCP - General (Physician Assistant)   PMHx, SurgHx, SocialHx, Medications, and Allergies were reviewed in the Visit Navigator and updated as appropriate.   Past Medical History:  Diagnosis Date  . Allergy   . Family history of breast cancer 08/17/2020  . Family history of colonic polyps 08/17/2020     Past Surgical History:  Procedure Laterality Date  . WISDOM TOOTH EXTRACTION       Family History  Problem Relation Age of  Onset  . Other Mother        heart arrythmia  . Alcohol abuse Father   . Heart murmur Father   . Cancer Father        signet ring adenocarcinoma, unknown primary (colon? pancreatic? stomach? appendix?), dx mid 50s  . Drug abuse Sister   . Cataracts Maternal Grandmother   . Memory loss Maternal Grandmother   . Breast cancer Paternal Grandmother        dx after 74  . ADD / ADHD Daughter   . Colon polyps Paternal Uncle   . Liver disease Neg Hx   . Esophageal cancer Neg Hx     Social History   Tobacco Use  . Smoking status: Former Smoker    Packs/day: 1.50    Years: 2.00    Pack years: 3.00    Types: Cigarettes    Quit date: 12/2011    Years since quitting: 8.7  . Smokeless tobacco: Never Used  Vaping Use  . Vaping Use: Never used  Substance Use Topics  . Alcohol use: Yes    Comment: rare  . Drug use: Never    Review of Systems:   Review of Systems  Constitutional: Negative for chills, fever, malaise/fatigue and weight loss.  HENT: Negative for hearing loss, sinus pain and sore throat.   Eyes: Negative for blurred vision.  Respiratory: Negative for cough and shortness of breath.  Cardiovascular: Negative for chest pain, palpitations and leg swelling.  Gastrointestinal: Negative for abdominal pain, constipation, diarrhea, heartburn, nausea and vomiting.  Genitourinary: Negative for dysuria, frequency and urgency.  Musculoskeletal: Negative for back pain, myalgias and neck pain.  Skin: Negative for itching and rash.  Neurological: Negative for dizziness, tingling, seizures, loss of consciousness and headaches.  Endo/Heme/Allergies: Negative for polydipsia.  Psychiatric/Behavioral: Negative for depression. The patient is not nervous/anxious.   All other systems reviewed and are negative.   Objective:   Vitals:   08/31/20 1312  BP: 120/80  Pulse: 77  Temp: 97.8 F (36.6 C)  SpO2: 97%   Body mass index is 37.75 kg/m.  General Appearance:  Alert,  cooperative, no distress, appears stated age  Head:  Normocephalic, without obvious abnormality, atraumatic  Eyes:  PERRL, conjunctiva/corneas clear, EOM's intact, fundi benign, both eyes       Ears:  Normal TM's and external ear canals, both ears  Nose: Nares normal, septum midline, mucosa normal, no drainage    or sinus tenderness  Throat: Lips, mucosa, and tongue normal; teeth and gums normal  Neck: Supple, symmetrical, trachea midline, no adenopathy; thyroid:  No enlargement/tenderness/nodules; no carotit bruit or JVD  Back:   Symmetric, no curvature, ROM normal, no CVA tenderness  Lungs:   Clear to auscultation bilaterally, respirations unlabored  Chest wall:  No tenderness or deformity  Heart:  Regular rate and rhythm, S1 and S2 normal, no murmur, rub   or gallop  Abdomen:   Soft, non-tender, bowel sounds active all four quadrants, no masses, no organomegaly  Extremities: Extremities normal, atraumatic, no cyanosis or edema  Prostate: Not done.   Skin: Skin color, texture, turgor normal, no rashes or lesions  Lymph nodes: Cervical, supraclavicular, and axillary nodes normal  Neurologic: CNII-XII grossly intact. Normal strength, sensation and reflexes throughout   No results found for this or any previous visit.  Assessment/Plan:   [name] was seen today for annual exam.  Diagnoses and all orders for this visit:  Routine physical examination Today patient counseled on age appropriate routine health concerns for screening and prevention, each reviewed and up to date or declined. Immunizations reviewed and up to date or declined. Labs ordered and reviewed. Risk factors for depression reviewed and negative. Hearing function and visual acuity are intact. ADLs screened and addressed as needed. Functional ability and level of safety reviewed and appropriate. Education, counseling and referrals performed based on assessed risks today. Patient provided with a copy of personalized plan for  preventive services.  Obesity, unspecified classification, unspecified obesity type, unspecified whether serious comorbidity present Encouraged continued healthy diet and exercise. -     CBC with Differential/Platelet; Future -     Comprehensive metabolic panel; Future -     Lipid panel; Future  Screening for HIV (human immunodeficiency virus) -     HIV Antibody (routine testing w rflx); Future  Encounter for screening for other viral diseases -     Hepatitis C antibody; Future  Need for prophylactic vaccination with combined diphtheria-tetanus-pertussis (DTP) vaccine -     Tdap vaccine greater than or equal to 7yo IM    Well Adult Exam: Labs ordered: Yes. Patient counseling was done. See below for items discussed. Discussed the patient's BMI.  The BMI is not in the acceptable range; BMI management plan is completed Follow up in one year.  Patient Counseling: [x]   Nutrition: Stressed importance of moderation in sodium/caffeine intake, saturated fat and cholesterol, caloric balance, sufficient intake  of fresh fruits, vegetables, and fiber.  [x]   Stressed the importance of regular exercise.   []   Substance Abuse: Discussed cessation/primary prevention of tobacco, alcohol, or other drug use; driving or other dangerous activities under the influence; availability of treatment for abuse.   [x]   Injury prevention: Discussed safety belts, safety helmets, smoke detector, smoking near bedding or upholstery.   []   Sexuality: Discussed sexually transmitted diseases, partner selection, use of condoms, avoidance of unintended pregnancy  and contraceptive alternatives.   [x]   Dental health: Discussed importance of regular tooth brushing, flossing, and dental visits.  [x]   Health maintenance and immunizations reviewed. Please refer to Health maintenance section.    CMA or LPN served as scribe during this visit. History, Physical, and Plan performed by medical provider. The above documentation has been  reviewed and is accurate and complete.  Inda Coke, PA-C [facility]

## 2020-08-31 NOTE — Patient Instructions (Signed)

## 2020-09-01 ENCOUNTER — Other Ambulatory Visit: Payer: Self-pay | Admitting: Physician Assistant

## 2020-09-01 DIAGNOSIS — R739 Hyperglycemia, unspecified: Secondary | ICD-10-CM

## 2020-09-01 DIAGNOSIS — E781 Pure hyperglyceridemia: Secondary | ICD-10-CM

## 2020-09-01 LAB — COMPREHENSIVE METABOLIC PANEL: Potassium: 4.6 mmol/L (ref 3.5–5.3)

## 2020-09-01 LAB — CBC WITH DIFFERENTIAL/PLATELET
HCT: 43.2 % (ref 38.5–50.0)
Monocytes Relative: 7.5 %

## 2020-09-01 LAB — LIPID PANEL: Non-HDL Cholesterol (Calc): 165 mg/dL (calc) — ABNORMAL HIGH (ref <130)

## 2020-09-02 LAB — COMPREHENSIVE METABOLIC PANEL
AST: 25 U/L (ref 10–40)
Albumin: 4.4 g/dL (ref 3.6–5.1)
Calcium: 9.3 mg/dL (ref 8.6–10.3)
Creat: 1.03 mg/dL (ref 0.60–1.35)
Globulin: 2.7 g/dL (calc) (ref 1.9–3.7)
Sodium: 141 mmol/L (ref 135–146)

## 2020-09-02 LAB — LIPID PANEL
HDL: 41 mg/dL (ref >=40)
Total CHOL/HDL Ratio: 5 (calc) — ABNORMAL HIGH (ref <5.0)
Triglycerides: 312 mg/dL — ABNORMAL HIGH (ref <150)

## 2020-09-02 LAB — CBC WITH DIFFERENTIAL/PLATELET
Platelets: 347 10*3/uL (ref 140–400)
Total Lymphocyte: 34.8 %

## 2020-09-03 LAB — HIV ANTIBODY (ROUTINE TESTING W REFLEX): HIV 1&2 Ab, 4th Generation: NONREACTIVE

## 2020-09-03 LAB — CBC WITH DIFFERENTIAL/PLATELET
Absolute Monocytes: 638 cells/uL (ref 200–950)
Basophils Relative: 0.7 %
Eosinophils Absolute: 612 cells/uL — ABNORMAL HIGH (ref 15–500)
Hemoglobin: 13.8 g/dL (ref 13.2–17.1)
Lymphs Abs: 2958 cells/uL (ref 850–3900)
MCH: 26 pg — ABNORMAL LOW (ref 27.0–33.0)
MCHC: 31.9 g/dL — ABNORMAL LOW (ref 32.0–36.0)
MCV: 81.5 fL (ref 80.0–100.0)
MPV: 10.5 fL (ref 7.5–12.5)
Neutro Abs: 4233 cells/uL (ref 1500–7800)
Neutrophils Relative %: 49.8 %
RBC: 5.3 10*6/uL (ref 4.20–5.80)
RDW: 13.5 % (ref 11.0–15.0)
WBC: 8.5 10*3/uL (ref 3.8–10.8)

## 2020-09-03 LAB — TEST AUTHORIZATION

## 2020-09-03 LAB — COMPREHENSIVE METABOLIC PANEL
AG Ratio: 1.6 (calc) (ref 1.0–2.5)
ALT: 43 U/L (ref 9–46)
Alkaline phosphatase (APISO): 71 U/L (ref 36–130)
BUN: 11 mg/dL (ref 7–25)
CO2: 29 mmol/L (ref 20–32)
Chloride: 105 mmol/L (ref 98–110)
Glucose, Bld: 114 mg/dL — ABNORMAL HIGH (ref 65–99)
Total Bilirubin: 0.4 mg/dL (ref 0.2–1.2)
Total Protein: 7.1 g/dL (ref 6.1–8.1)

## 2020-09-03 LAB — HEMOGLOBIN A1C
Hgb A1c MFr Bld: 5.8 % of total Hgb — ABNORMAL HIGH (ref <5.7)
Mean Plasma Glucose: 120 (calc)
eAG (mmol/L): 6.6 (calc)

## 2020-09-03 LAB — LIPID PANEL
Cholesterol: 206 mg/dL — ABNORMAL HIGH (ref <200)
LDL Cholesterol (Calc): 120 mg/dL (calc) — ABNORMAL HIGH

## 2020-09-03 LAB — HEPATITIS C ANTIBODY
Hepatitis C Ab: NONREACTIVE
SIGNAL TO CUT-OFF: 0.02 (ref <1.00)

## 2020-09-08 NOTE — Progress Notes (Signed)
Agree with the assessment and plan as outlined by Amy Esterwood, PA-C.  Arcadio Cope, DO, FACG  

## 2020-09-27 ENCOUNTER — Encounter: Payer: Self-pay | Admitting: Gastroenterology

## 2020-09-29 ENCOUNTER — Other Ambulatory Visit: Payer: Self-pay

## 2020-09-29 ENCOUNTER — Telehealth: Payer: Self-pay | Admitting: General Surgery

## 2020-09-29 ENCOUNTER — Ambulatory Visit (AMBULATORY_SURGERY_CENTER): Payer: 59 | Admitting: Gastroenterology

## 2020-09-29 ENCOUNTER — Encounter: Payer: Self-pay | Admitting: Gastroenterology

## 2020-09-29 VITALS — BP 110/71 | HR 73 | Temp 98.8°F | Resp 11 | Ht 67.0 in | Wt 239.0 lb

## 2020-09-29 DIAGNOSIS — Z800 Family history of malignant neoplasm of digestive organs: Secondary | ICD-10-CM

## 2020-09-29 DIAGNOSIS — K5730 Diverticulosis of large intestine without perforation or abscess without bleeding: Secondary | ICD-10-CM | POA: Diagnosis not present

## 2020-09-29 DIAGNOSIS — D120 Benign neoplasm of cecum: Secondary | ICD-10-CM

## 2020-09-29 DIAGNOSIS — K921 Melena: Secondary | ICD-10-CM | POA: Diagnosis not present

## 2020-09-29 DIAGNOSIS — K625 Hemorrhage of anus and rectum: Secondary | ICD-10-CM

## 2020-09-29 DIAGNOSIS — D124 Benign neoplasm of descending colon: Secondary | ICD-10-CM

## 2020-09-29 DIAGNOSIS — K5900 Constipation, unspecified: Secondary | ICD-10-CM

## 2020-09-29 DIAGNOSIS — K640 First degree hemorrhoids: Secondary | ICD-10-CM

## 2020-09-29 MED ORDER — SODIUM CHLORIDE 0.9 % IV SOLN: 500.0000 mL | Freq: Once | INTRAVENOUS | Status: DC

## 2020-09-29 NOTE — Progress Notes (Signed)
pt tolerated well. VSS. awake and to recovery. Report given to RN.  

## 2020-09-29 NOTE — Op Note (Signed)
[place] Patient Name: David Meadows Procedure Date: 09/29/2020 8:42 AM MRN: [id number] Endoscopist: Gerrit Heck , MD Age: 39 Referring MD:  Date of Birth: 1981/01/30 Gender: Male Account #: [id number] Procedure:                Colonoscopy Indications:              Screening in patient at increased risk: Colorectal                            cancer in father before age 42. Father diagnosed                            with Signet Adenocarcinima of the cecum at age 66.                           Incidentally, he also endorses intermittent                            hematochezia and pruritis ani. Medicines:                Monitored Anesthesia Care Procedure:                Pre-Anesthesia Assessment:                           - Prior to the procedure, a History and Physical                            was performed, and patient medications and                            allergies were reviewed. The patient's tolerance of                            previous anesthesia was also reviewed. The risks                            and benefits of the procedure and the sedation                            options and risks were discussed with the patient.                            All questions were answered, and informed consent                            was obtained. Prior Anticoagulants: The patient has                            taken no previous anticoagulant or antiplatelet                            agents. ASA Grade Assessment: II - A patient with  mild systemic disease. After reviewing the risks                            and benefits, the patient was deemed in                            satisfactory condition to undergo the procedure.                           After obtaining informed consent, the colonoscope                            was passed under direct vision. Throughout the                            procedure, the patient's blood  pressure, pulse, and                            oxygen saturations were monitored continuously. The                            Colonoscope was introduced through the anus and                            advanced to the the terminal ileum. The colonoscopy                            was performed without difficulty. The patient                            tolerated the procedure well. The quality of the                            bowel preparation was good. The terminal ileum,                            ileocecal valve, appendiceal orifice, and rectum                            were photographed. Scope In: 8:56:13 AM Scope Out: 9:15:14 AM Scope Withdrawal Time: 0 hours 17 minutes 7 seconds  Total Procedure Duration: 0 hours 19 minutes 1 second  Findings:                 The perianal and digital rectal examinations were                            normal.                           Three sessile polyps were found in the descending                            colon and cecum (2). The polyps were 2 to 4 mm in  size. These polyps were removed with a cold snare.                            Resection and retrieval were complete. Estimated                            blood loss was minimal.                           A few small-mouthed diverticula were found in the                            sigmoid colon.                           Non-bleeding internal hemorrhoids were found during                            retroflexion. The hemorrhoids were small and Grade                            I (internal hemorrhoids that do not prolapse).                           The terminal ileum appeared normal. Complications:            No immediate complications. Estimated Blood Loss:     Estimated blood loss was minimal. Impression:               - Three 2 to 4 mm polyps in the descending colon                            and in the cecum, removed with a cold snare.                             Resected and retrieved.                           - Diverticulosis in the sigmoid colon.                           - Non-bleeding internal hemorrhoids.                           - The examined portion of the ileum was normal. Recommendation:           - Patient has a contact number available for                            emergencies. The signs and symptoms of potential                            delayed complications were discussed with the                            patient. Return to  normal activities tomorrow.                            Written discharge instructions were provided to the                            patient.                           - Resume previous diet.                           - Continue present medications.                           - Await pathology results.                           - Repeat colonoscopy in 3 - 5 years for                            surveillance based on pathology results.                           - Return to GI office PRN.                           - Use fiber, for example Citrucel, Fibercon, Konsyl                            or Metamucil.                           - Internal hemorrhoids were noted on this study and                            may be amenable to hemorrhoid band ligation. If you                            are interested in further treatment of these                            hemorrhoids with band ligation, please contact my                            clinic to set up an appointment for evaluation and                            treatment. Gerrit Heck, MD 09/29/2020 9:20:30 AM

## 2020-09-29 NOTE — Telephone Encounter (Signed)
-----   Message from Lund, DO sent at 09/29/2020  1:28 PM EST ----- Regarding: Hemorrhoid banding Please set this patient up for hemorrhoid banding. Also will need CT abdomen with PO contrast to evaluate for extraluminal mass due to family history of the same. Thanks.

## 2020-09-29 NOTE — Progress Notes (Signed)
History reviewed today  VS CW  

## 2020-09-29 NOTE — Patient Instructions (Signed)
HANDOUTS PROVIDED ON: diverticulosis, polyps, high fiber diet, hemorrhoids and hemorrhoidal banding.  The polyps removed today have been sent for pathology.  The results can take 1-3 weeks to receive.  When your next colonoscopy should occur will be based on the pathology results.    You may resume your previous diet and medication schedule. Use fiber, for example Citrucel, Fibercon, Konsyl or Metamucil.  Thank you for allowing [place] to care for you today!!!   YOU HAD AN ENDOSCOPIC PROCEDURE TODAY AT [place]:   Refer to the procedure report that was given to you for any specific questions about what was found during the examination.  If the procedure report does not answer your questions, please call your gastroenterologist to clarify.  If you requested that your care partner not be given the details of your procedure findings, then the procedure report has been included in a sealed envelope for you to review at your convenience later.  YOU SHOULD EXPECT: Some feelings of bloating in the abdomen. Passage of more gas than usual.  Walking can help get rid of the air that was put into your GI tract during the procedure and reduce the bloating. If you had a lower endoscopy (such as a colonoscopy or flexible sigmoidoscopy) you may notice spotting of blood in your stool or on the toilet paper. If you underwent a bowel prep for your procedure, you may not have a normal bowel movement for a few days.  Please Note:  You might notice some irritation and congestion in your nose or some drainage.  This is from the oxygen used during your procedure.  There is no need for concern and it should clear up in a day or so.  SYMPTOMS TO REPORT IMMEDIATELY:   Following lower endoscopy (colonoscopy or flexible sigmoidoscopy):  Excessive amounts of blood in the stool  Significant tenderness or worsening of abdominal pains  Swelling of the abdomen that is new, acute  Fever of 100F or higher   For  urgent or emergent issues, a gastroenterologist can be reached at any hour by calling [phone]. Do not use MyChart messaging for urgent concerns.    DIET:  We do recommend a small meal at first, but then you may proceed to your regular diet.  Drink plenty of fluids but you should avoid alcoholic beverages for 24 hours.  ACTIVITY:  You should plan to take it easy for the rest of today and you should NOT DRIVE or use heavy machinery until tomorrow (because of the sedation medicines used during the test).    FOLLOW UP: Our staff will call the number listed on your records 48-72 hours following your procedure to check on you and address any questions or concerns that you may have regarding the information given to you following your procedure. If we do not reach you, we will leave a message.  We will attempt to reach you two times.  During this call, we will ask if you have developed any symptoms of COVID 19. If you develop any symptoms (ie: fever, flu-like symptoms, shortness of breath, cough etc.) before then, please call [phone].  If you test positive for Covid 19 in the 2 weeks post procedure, please call and report this information to [place].    If any biopsies were taken you will be contacted by phone or by letter within the next 1-3 weeks.  Please call us at [phone] if you have not heard about the biopsies in 3  weeks.    SIGNATURES/CONFIDENTIALITY: You and/or your care partner have signed paperwork which will be entered into your electronic medical record.  These signatures attest to the fact that that the information above on your After Visit Summary has been reviewed and is understood.  Full responsibility of the confidentiality of this discharge information lies with you and/or your care-partner.

## 2020-09-29 NOTE — Telephone Encounter (Signed)
Spoke with David Meadows to schedule the patient at [facility], She contacted radiology for me to make sure we put in the test correctly. Pt scheduled 10/05/2020 @1 :00pm at [facility].

## 2020-10-01 ENCOUNTER — Telehealth: Payer: Self-pay

## 2020-10-01 ENCOUNTER — Telehealth: Payer: Self-pay | Admitting: Gastroenterology

## 2020-10-01 NOTE — Telephone Encounter (Signed)
Spoke to patient to inform him of the reason why Dr Bryan Lemma felt it was beneficial for patient to have a CT of abd/pelvis due to family HX of his father with colon ca at a young age. All questions answered. Patient voiced understanding.

## 2020-10-01 NOTE — Telephone Encounter (Signed)
°  Follow up Call-  Call back number 09/29/2020  Post procedure Call Back phone  # [phone]  Permission to leave phone message No  Some recent data might be hidden     Patient questions:  Do you have a fever, pain , or abdominal swelling? No. Pain Score  0 *  Have you tolerated food without any problems? Yes.    Have you been able to return to your normal activities? Yes.    Do you have any questions about your discharge instructions: Diet   No. Medications  No. Follow up visit  No.  Do you have questions or concerns about your Care? No.  Actions: * If pain score is 4 or above: No action needed, pain <4. 1. Have you developed a fever since your procedure? no  2.   Have you had an respiratory symptoms (SOB or cough) since your procedure? no  3.   Have you tested positive for COVID 19 since your procedure no  4.   Have you had any family members/close contacts diagnosed with the COVID 19 since your procedure?  no   If yes to any of these questions please route to Joylene John, RN and Joella Prince, RN

## 2020-10-01 NOTE — Telephone Encounter (Signed)
Patient called inquiring about the CT scan states he does not recall discussing it with Dr. Loletha Grayer

## 2020-10-05 ENCOUNTER — Ambulatory Visit (HOSPITAL_COMMUNITY): Payer: 59

## 2020-10-12 ENCOUNTER — Encounter: Payer: Self-pay | Admitting: Gastroenterology

## 2020-10-19 ENCOUNTER — Other Ambulatory Visit: Payer: Self-pay

## 2020-10-19 ENCOUNTER — Ambulatory Visit (HOSPITAL_COMMUNITY)
Admission: RE | Admit: 2020-10-19 | Discharge: 2020-10-19 | Disposition: A | Discharge status: Home / Self Care | Payer: 59 | Source: Ambulatory Visit | Attending: Gastroenterology | Admitting: Gastroenterology

## 2020-10-19 DIAGNOSIS — K625 Hemorrhage of anus and rectum: Secondary | ICD-10-CM | POA: Diagnosis not present

## 2020-10-19 DIAGNOSIS — Z800 Family history of malignant neoplasm of digestive organs: Secondary | ICD-10-CM | POA: Insufficient documentation

## 2020-10-19 DIAGNOSIS — K5900 Constipation, unspecified: Secondary | ICD-10-CM

## 2020-10-19 MED ORDER — IOHEXOL 300 MG/ML  SOLN
100.0000 mL | Freq: Once | INTRAMUSCULAR | Status: AC | PRN
  Administered 2020-10-19: 100 mL via INTRAVENOUS

## 2020-10-20 ENCOUNTER — Telehealth: Payer: Self-pay | Admitting: General Surgery

## 2020-10-20 NOTE — Telephone Encounter (Signed)
-----   Message from [place], DO sent at 10/20/2020 10:44 AM EST ----- CT abdomen/pelvis reviewed.  Other than sigmoid diverticulosis, normal-appearing abdomen and GI tract.  Normal liver.  Small right inguinal hernia incidentally noted.0.4 x 0.3 x 0.2 cm left lower lobe pulmonary nodule incidentally noted.  I recommend follow-up with PCM to discuss whether or not CT chest is needed for further evaluation.  Per Radiologist's recommendation, this can be done in 12 months in a patient that is at elevated risk.Otherwise, no further imaging needed from a GI standpoint.

## 2020-10-20 NOTE — Telephone Encounter (Signed)
Spoke with the patient regarding his CT. Patient verbalized understanding.

## 2020-11-11 ENCOUNTER — Telehealth: Payer: Self-pay | Admitting: Gastroenterology

## 2020-11-11 NOTE — Telephone Encounter (Signed)
Appointment cancelled

## 2020-11-16 ENCOUNTER — Ambulatory Visit: Payer: 59 | Admitting: Gastroenterology

## 2021-02-01 ENCOUNTER — Encounter: Payer: Self-pay | Admitting: Physician Assistant

## 2021-02-02 ENCOUNTER — Ambulatory Visit: Payer: 59 | Admitting: Physician Assistant

## 2021-02-02 ENCOUNTER — Other Ambulatory Visit: Payer: Self-pay

## 2021-02-02 ENCOUNTER — Encounter: Payer: Self-pay | Admitting: Physician Assistant

## 2021-02-02 VITALS — BP 122/84 | HR 90 | Temp 97.7°F | Ht 67.0 in | Wt 225.0 lb

## 2021-02-02 DIAGNOSIS — M79644 Pain in right finger(s): Secondary | ICD-10-CM

## 2021-02-02 NOTE — Patient Instructions (Signed)
It was great to see you!  Try the ace bandages for wrapping as well to see if you can get good relief from that as well.  A referral has been placed for you to see Dr. Lynne Leader or Dr. Charlann Boxer with [facility]. Someone from there office will be in touch soon regarding your appointment with him. His location: [place] at [facility] [address] on the 1st floor.   Phone number [phone], Fax [phone].  This location is across the street from the entrance to [employer] and in the same complex as the [facility] and [employer] bank  Take care,  Inda Coke PA-C

## 2021-02-02 NOTE — Progress Notes (Signed)
David Meadows is a 40 y.o. male here for a new problem.  I acted as a Education administrator for [employer], PA-C Anselmo Pickler, LPN   History of Present Illness:   Chief Complaint  Patient presents with  . Thumb pain    HPI   Thumb pain Pt c/o right thumb pain x 1 week or so. Taking Ibuprofen 600-800 mg prn at this time. Denies injury to thumb. He is L-handed but does use his R hand for navigating a mouse. 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 Has had some weakness due to this pain. Has tried coban homemade wrap at home that has helped during day but not at night.     Wt Readings from Last 4 Encounters:  02/02/21 225 lb (102.1 kg)  09/29/20 239 lb (108.4 kg)  08/31/20 241 lb (109.3 kg)  08/30/20 239 lb 12.8 oz (108.8 kg)     Past Medical History:  Diagnosis Date  . Allergy   . Family history of breast cancer 08/17/2020  . Family history of colonic polyps 08/17/2020     Social History   Tobacco Use  . Smoking status: Former Smoker    Packs/day: 1.50    Years: 2.00    Pack years: 3.00    Types: Cigarettes    Quit date: 12/2011    Years since quitting: 9.1  . Smokeless tobacco: Never Used  Vaping Use  . Vaping Use: Never used  Substance Use Topics  . Alcohol use: Yes    Comment: rare  . Drug use: Never    Past Surgical History:  Procedure Laterality Date  . POPLITEAL SYNOVIAL CYST EXCISION Right 2006  . WISDOM TOOTH EXTRACTION      Family History  Problem Relation Age of Onset  . Other Mother        heart arrythmia  . Alcohol abuse Father   . Heart murmur Father   . Cancer Father        signet ring adenocarcinoma, unknown primary (colon? pancreatic? stomach? appendix?), dx mid 60s  . Colon cancer Father   . Stomach cancer Father   . Drug abuse Sister   . Cataracts Maternal Grandmother   . Memory loss Maternal Grandmother   . Breast cancer Paternal Grandmother        dx after 98  . ADD / ADHD Daughter   . Colon polyps Paternal Uncle    . Liver disease Neg Hx   . Esophageal cancer Neg Hx   . Rectal cancer Neg Hx     Allergies  Allergen Reactions  . Other     Mild wheat allergy per patient    Current Medications:   Current Outpatient Medications:  .  Amphetamine ER (ADZENYS XR-ODT) 18.8 MG TBED, Take 2 tablets by mouth daily in the afternoon., Disp: , Rfl:  .  ibuprofen (ADVIL) 200 MG tablet, Take 600 mg by mouth as needed., Disp: , Rfl:  .  loratadine (CLARITIN) 10 MG tablet, Take 10 mg by mouth daily., Disp: , Rfl:  .  OVER THE COUNTER MEDICATION, Take 2 capsules by mouth daily. [employer], Disp: , Rfl:    Review of Systems:   ROS Negative unless otherwise specified per HPI.  Vitals:   Vitals:   02/02/21 1132  BP: 122/84  Pulse: 90  Temp: 97.7 F (36.5 C)  TempSrc: Temporal  SpO2: 98%  Weight: 225 lb (102.1 kg)  Height: 5\' 7"  (1.702 m)  Body mass index is 35.24 kg/m.  Physical Exam:   Physical Exam Vitals and nursing note reviewed.  Constitutional:      Appearance: He is well-developed.  HENT:     Head: Normocephalic.  Eyes:     Conjunctiva/sclera: Conjunctivae normal.     Pupils: Pupils are equal, round, and reactive to light.  Pulmonary:     Effort: Pulmonary effort is normal.  Musculoskeletal:        General: Normal range of motion.     Cervical back: Normal range of motion.     Comments: Decreased ROM with opposition of R thumb to R fifth finger due to pain TTP to MCP and CMC joint of R thumb  Skin:    General: Skin is warm and dry.  Neurological:     Mental Status: He is alert and oriented to person, place, and time.  Psychiatric:        Behavior: Behavior normal.        Thought Content: Thought content normal.        Judgment: Judgment normal.      Assessment and Plan:   Tierra was seen today for thumb pain.  Diagnoses and all orders for this visit:  Pain of right thumb -     Ambulatory referral to Sports Medicine   Suspect possible  CMC arthritis and/or early deQuervain's. Referral to sports medicine for further evaluation and management.  CMA or LPN served as scribe during this visit. History, Physical, and Plan performed by medical provider. The above documentation has been reviewed and is accurate and complete.  Inda Coke, PA-C

## 2021-02-04 ENCOUNTER — Ambulatory Visit: Payer: 59 | Admitting: Physician Assistant

## 2021-02-08 NOTE — Progress Notes (Signed)
Subjective:    I'm seeing this patient as a consultation for:  [name], [place]. Note will be routed back to referring provider/PCP.  CC: R thumb pain  I, Molly Weber, LAT, ATC, am serving as scribe for Dr. Lynne Leader.  HPI: Pt is a 40 y/o male presenting w/ c/o R thumb pain x approximately 2 weeks w/ no known MOI.  He locates his pain to his R webspace / medial thumb.  He has been working 9-12 hours/day w/ little time off over the last month which may be contributing to his pain.  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  R thumb swelling: No R thumb mechanical symptoms: no Aggravating factors: mousing; opening things such as doors or bottles;  Thumb flexion/opposition Treatments tried: self-made thumb wrap; compression wrap; IBU  Past medical history, Surgical history, Family history, Social history, Allergies, and medications have been entered into the medical record, reviewed.   Review of Systems: No new headache, visual changes, nausea, vomiting, diarrhea, constipation, dizziness, abdominal pain, skin rash, fevers, chills, night sweats, weight loss, swollen lymph nodes, body aches, joint swelling, muscle aches, chest pain, shortness of breath, mood changes, visual or auditory hallucinations.   Objective:    Vitals:   02/09/21 1126  BP: 132/90  Pulse: (!) 111  SpO2: 99%   General: Well Developed, well nourished, and in no acute distress.  Neuro/Psych: Alert and oriented x3, extra-ocular muscles intact, able to move all 4 extremities, sensation grossly intact. Skin: Warm and dry, no rashes noted.  Respiratory: Not using accessory muscles, speaking in full sentences, trachea midline.  Cardiovascular: Pulses palpable, no extremity edema. Abdomen: Does not appear distended. MSK: right hand is normal appearing.  Normal motion and strength.  Mildly tender palpation ulnar aspect thumb MCP. No laxity present with UCL stress test  thumb. Pulses capillary refill and sensation are intact distally.  Lab and Radiology Results  Diagnostic Limited MSK Ultrasound of: Right thumb first MCP No significant joint effusion.  Normal structures ulnar radial and dorsal and volar. Tender palpation ultrasound probe at ulnar aspect of the MCP Impression: Normal-appearing MSK ultrasound thumb   Impression and Recommendations:    Assessment and Plan: 40 y.o. male with right thumb pain and ulnar aspect of thumb at MCP.  Patient has been doing repetitive activity with his mouse more effectively he squeezes the mouse between his thumb and his index finger.  This is activation of the adductor pollicis muscle.  The attachment is near here and I think he may be having some tendinopathy.  Plan for relative rest with this activity along with a thumb spica splint and Voltaren gel.  If not improving he will let me know in a few weeks and will refer to hand therapy.  Ultimately may benefit from steroid injection however now I think he will benefit from conservative management.  Recheck back as needed.  PDMP not reviewed this encounter. Orders Placed This Encounter  Procedures  . [place] LIMITED JOINT SPACE STRUCTURES UP RIGHT(NO LINKED CHARGES)    Order Specific Question:   Reason for Exam (SYMPTOM  OR DIAGNOSIS REQUIRED)    Answer:   R thumb pain    Order Specific Question:   Preferred imaging location?    Answer:   [place]   No orders of the defined types were placed in this encounter.   Discussed warning signs or symptoms. Please see discharge instructions. Patient  expresses understanding.   The above documentation has been reviewed and is accurate and complete Lynne Leader, M.D.

## 2021-02-09 ENCOUNTER — Other Ambulatory Visit: Payer: Self-pay

## 2021-02-09 ENCOUNTER — Ambulatory Visit: Payer: Self-pay

## 2021-02-09 ENCOUNTER — Ambulatory Visit (INDEPENDENT_AMBULATORY_CARE_PROVIDER_SITE_OTHER): Payer: 59 | Admitting: Family Medicine

## 2021-02-09 ENCOUNTER — Encounter: Payer: Self-pay | Admitting: Family Medicine

## 2021-02-09 VITALS — BP 132/90 | HR 111 | Ht 67.0 in | Wt 224.8 lb

## 2021-02-09 DIAGNOSIS — M79644 Pain in right finger(s): Secondary | ICD-10-CM | POA: Diagnosis not present

## 2021-02-09 NOTE — Patient Instructions (Signed)
Thank you for coming in today.  Please use voltaren gel up to 4x daily for pain as needed.   Thumb spica splint or TeePee splint.   If not improving in 1-2 weeks let me know and I will refer to hand therapy.   Please go to [facility] supply to get the thumb brace we talked about today. You may also be able to get it from [employer].

## 2021-06-15 ENCOUNTER — Encounter: Payer: Self-pay | Admitting: Physician Assistant

## 2021-06-15 NOTE — Telephone Encounter (Signed)
Dr. Jonni Sanger please review message and advise any further recommendations.

## 2021-09-19 ENCOUNTER — Encounter: Payer: Self-pay | Admitting: Physician Assistant

## 2021-09-21 ENCOUNTER — Other Ambulatory Visit: Payer: Self-pay

## 2021-09-21 ENCOUNTER — Encounter: Payer: Self-pay | Admitting: Physician Assistant

## 2021-09-21 ENCOUNTER — Ambulatory Visit: Payer: 59 | Admitting: Physician Assistant

## 2021-09-21 VITALS — BP 120/80 | HR 122 | Temp 98.4°F | Ht 67.0 in | Wt 217.5 lb

## 2021-09-21 DIAGNOSIS — J069 Acute upper respiratory infection, unspecified: Secondary | ICD-10-CM

## 2021-09-21 LAB — POCT RAPID STREP A (OFFICE): Rapid Strep A Screen: NEGATIVE

## 2021-09-21 NOTE — Progress Notes (Signed)
David Meadows is a 40 y.o. male here for productive cough.  History of Present Illness:   Chief Complaint  Patient presents with   Cough    Pt is c/o cough off and on x 1 week, coughing and expectorating green sputum. Denies fever or chills, no headache.    HPI  Productive Cough David Meadows presents with c/o of a intermittent productive cough that has been onset for one week. Oral reports he began feeling sick on 09/04/21 and was experiencing the same sx as his fiancee who tested positive for COVID the same day. Upon experiencing this David Meadows tested for COVID two days later which resulted as negative. Despite this he continued to quarantine as though he tested positive and took tylenol to tx his sx such as fever.   According to David Meadows, his cough didn't start until his then fever had subsided, two days after his COVID test. States the cough produces green sputum as well as tightness in his throat upon deep inhalation. Pt has also cough is triggered while he is in warm environments such as his shower, under the covers, or a room of at least 70 degrees. The cough is also exacerbated upon laughing or lying down on his back. He has taken mucinex once which provided temporary relief but hasn't taken it since. Denies fever, chills, CP, ear pressure, sore throat, or HA.    Past Medical History:  Diagnosis Date   Allergy    Family history of breast cancer 08/17/2020   Family history of colonic polyps 08/17/2020     Social History   Tobacco Use   Smoking status: Former    Packs/day: 1.50    Years: 2.00    Pack years: 3.00    Types: Cigarettes    Quit date: 12/2011    Years since quitting: 9.7   Smokeless tobacco: Never  Vaping Use   Vaping Use: Never used  Substance Use Topics   Alcohol use: Yes    Comment: rare   Drug use: Never    Past Surgical History:  Procedure Laterality Date   POPLITEAL SYNOVIAL CYST EXCISION Right 2006   WISDOM TOOTH EXTRACTION      Family  History  Problem Relation Age of Onset   Other Mother        heart arrythmia   Alcohol abuse Father    Heart murmur Father    Cancer Father        signet ring adenocarcinoma, unknown primary (colon? pancreatic? stomach? appendix?), dx mid 45s   Colon cancer Father    Stomach cancer Father    Drug abuse Sister    Cataracts Maternal Grandmother    Memory loss Maternal Grandmother    Breast cancer Paternal Grandmother        dx after 61   ADD / ADHD Daughter    Colon polyps Paternal Uncle    Liver disease Neg Hx    Esophageal cancer Neg Hx    Rectal cancer Neg Hx     Allergies  Allergen Reactions   Other     Mild wheat allergy per patient    Current Medications:   Current Outpatient Medications:    Amphetamine ER (ADZENYS XR-ODT) 18.8 MG TBED, Take 2 tablets by mouth daily in the afternoon., Disp: , Rfl:    ibuprofen (ADVIL) 200 MG tablet, Take 600 mg by mouth as needed., Disp: , Rfl:    loratadine (CLARITIN) 10 MG tablet, Take 10 mg by mouth daily., Disp: ,  Rfl:    Multiple Vitamins-Minerals (ALIVE MENS ENERGY) TABS, Take 1 tablet by mouth daily in the afternoon., Disp: , Rfl:    Review of Systems:   ROS Negative unless otherwise specified per HPI.  Vitals:   Vitals:   09/21/21 1503  BP: 120/80  Pulse: (!) 122  Temp: 98.4 F (36.9 C)  TempSrc: Temporal  SpO2: 97%  Weight: 217 lb 8 oz (98.7 kg)  Height: 5\' 7"  (1.702 m)     Body mass index is 34.07 kg/m.  Physical Exam:   Physical Exam Vitals and nursing note reviewed.  Constitutional:      General: He is not in acute distress.    Appearance: He is well-developed. He is not ill-appearing or toxic-appearing.  HENT:     Head: Normocephalic and atraumatic.     Right Ear: Tympanic membrane, ear canal and external ear normal. Tympanic membrane is not erythematous, retracted or bulging.     Left Ear: Tympanic membrane, ear canal and external ear normal. Tympanic membrane is not erythematous, retracted or  bulging.     Nose: Nose normal.     Right Sinus: No maxillary sinus tenderness or frontal sinus tenderness.     Left Sinus: No maxillary sinus tenderness or frontal sinus tenderness.     Mouth/Throat:     Pharynx: Uvula midline. No posterior oropharyngeal erythema.     Comments: Bilateral tonsillar exudate  Eyes:     General: Lids are normal.     Conjunctiva/sclera: Conjunctivae normal.  Neck:     Trachea: Trachea normal.  Cardiovascular:     Rate and Rhythm: Normal rate and regular rhythm.     Pulses: Normal pulses.     Heart sounds: Normal heart sounds, S1 normal and S2 normal.  Pulmonary:     Effort: Pulmonary effort is normal.     Breath sounds: Normal breath sounds. No decreased breath sounds, wheezing, rhonchi or rales.  Lymphadenopathy:     Cervical: No cervical adenopathy.  Skin:    General: Skin is warm and dry.  Neurological:     Mental Status: He is alert.     GCS: GCS eye subscore is 4. GCS verbal subscore is 5. GCS motor subscore is 6.  Psychiatric:        Speech: Speech normal.        Behavior: Behavior normal. Behavior is cooperative.   Results for orders placed or performed in visit on 09/21/21  POCT rapid strep A  Result Value Ref Range   Rapid Strep A Screen Negative Negative    Assessment and Plan:   Upper respiratory tract infection, unspecified type - Plan: POCT rapid strep A  Strep test negative Symptoms are overall improving with time; suspect post viral cough No obvious signs of infection at this time, I do not feel as though patient needs antibiotics at this time Continue to push fluids, rest Consider dextromethorphan for cough if needed Follow-up if any new/worsening sx   I,Havlyn C Ratchford,acting as a scribe for [employer], PA.,have documented all relevant documentation on the behalf of Inda Coke, PA,as directed by  Inda Coke, PA while in the presence of Inda Coke, [place].   I, Inda Coke, [place], have reviewed all  documentation for this visit. The documentation on 09/21/21 for the exam, diagnosis, procedures, and orders are all accurate and complete.   Inda Coke, PA-C

## 2021-09-21 NOTE — Patient Instructions (Addendum)
It was great to see you!  You have a viral upper respiratory infection. Antibiotics are not needed for this.  Viral infections usually take 7-10 days to resolve.  The cough can last a few weeks to go away.  Trial dextromethorphan (DM)  Push fluids and get plenty of rest. Please return if you are not improving as expected, or if you have high fevers (>101.5) or difficulty swallowing or worsening productive cough.  Call clinic with questions.  I hope you start feeling better soon!

## 2021-10-02 IMAGING — CT CT ABD-PELV W/ CM
2 of 4 series · 16 of 46 positions shown, 18 images · IV contrast (omnipaque)
Comparison: Radiographs of 08/29/2006

CLINICAL DATA: Polyps removed on recent colonoscopy (three sessile
polyps from the descending colon and rectum, 2-4 mm in size, at
least 1 representing tubular adenoma, no high-grade
dysplasia/malignancy identified), family history of colon cancer.
Rectal bleeding. Constipation.

EXAM:
CT ABDOMEN AND PELVIS WITH CONTRAST
TECHNIQUE: Multidetector CT imaging of the abdomen and pelvis was performed
using the standard protocol following bolus administration of
intravenous contrast.
CONTRAST:  100mL OMNIPAQUE IOHEXOL 300 MG/ML  SOLN

[Series 3: axial st · axial · 0.96mm/px · z∈[-830,-360]mm · 13 of 108 slices shown, 15 images]
[im 7/108  soft-tissue]
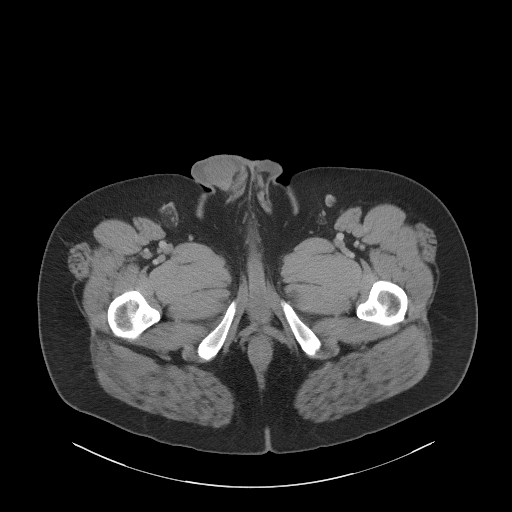
[im 7/108  bone]
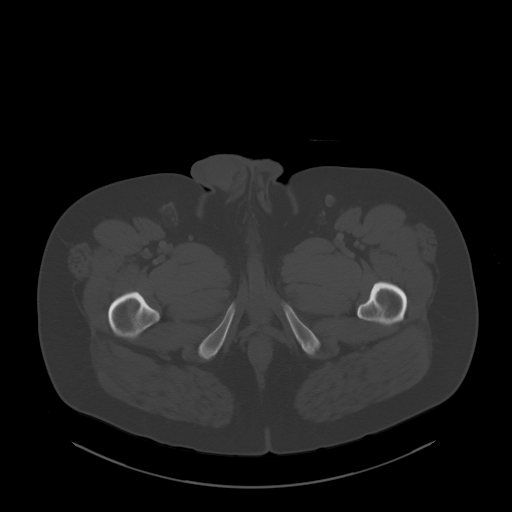
[im 13/108  soft-tissue]
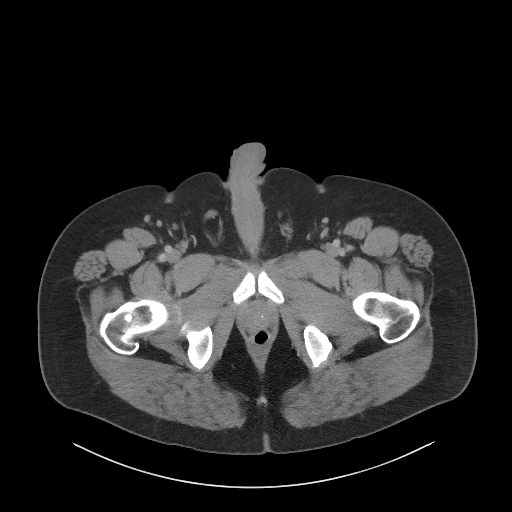
[im 26/108  soft-tissue]
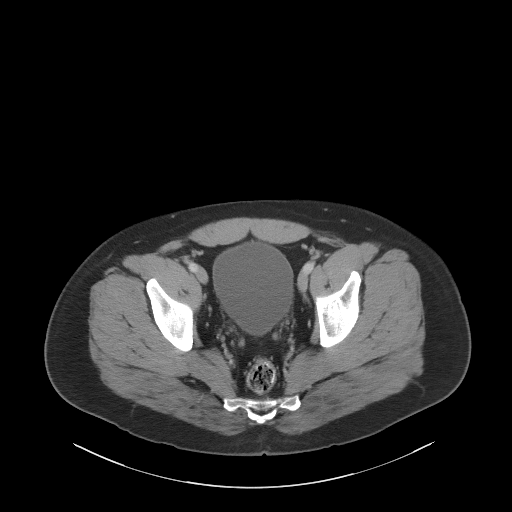
[im 32/108  soft-tissue]
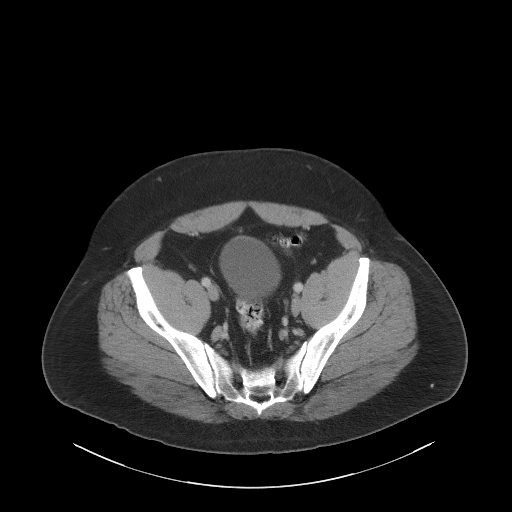
[im 38/108  soft-tissue]
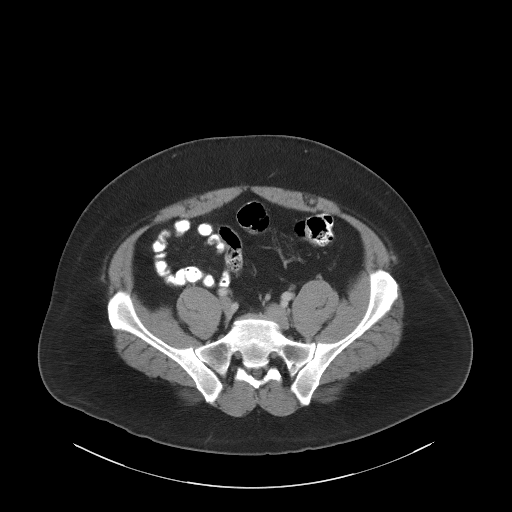
[im 45/108  soft-tissue]
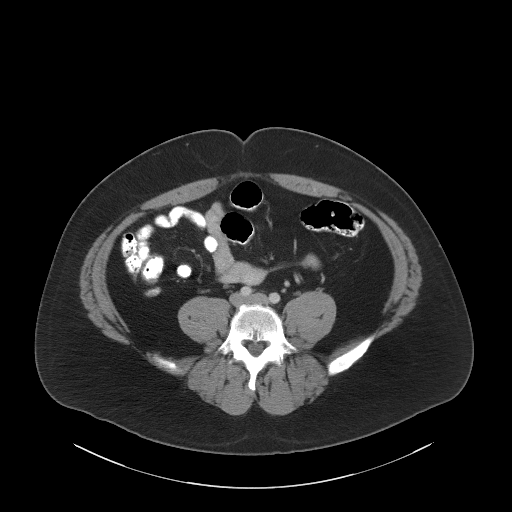
[im 57/108  soft-tissue]
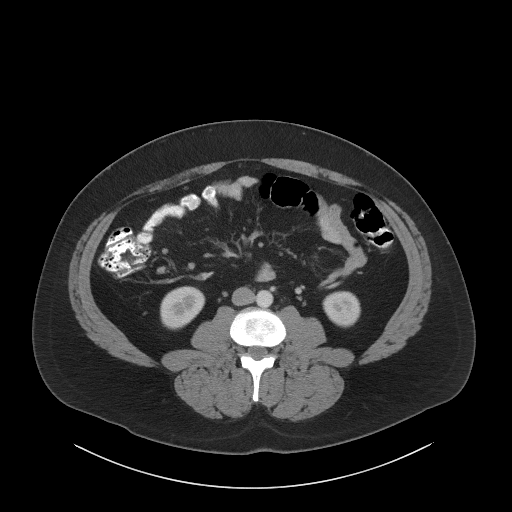
[im 63/108  soft-tissue]
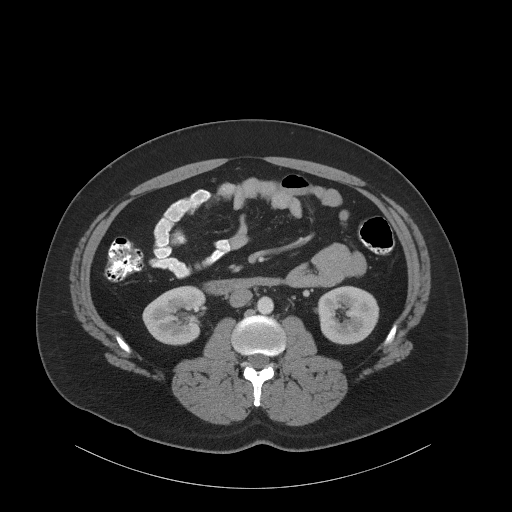
[im 70/108  soft-tissue]
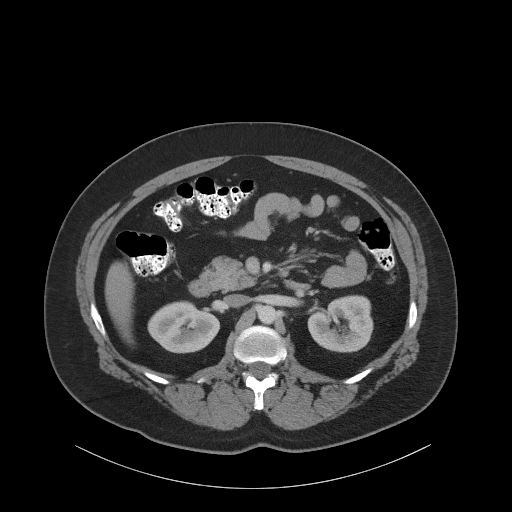
[im 70/108  bone]
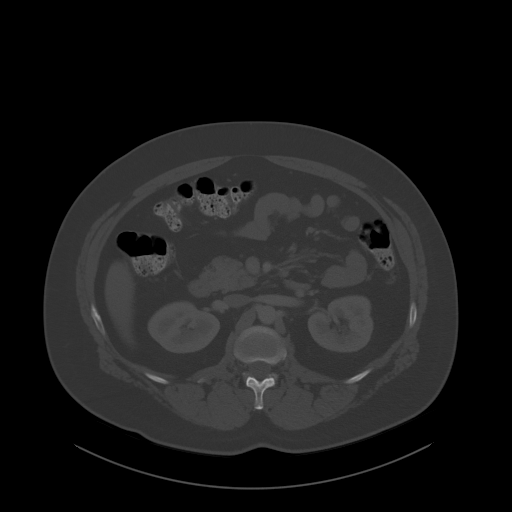
[im 76/108  soft-tissue]
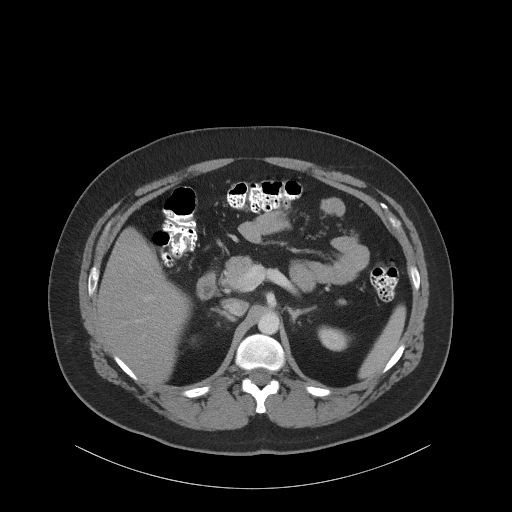
[im 82/108  soft-tissue]
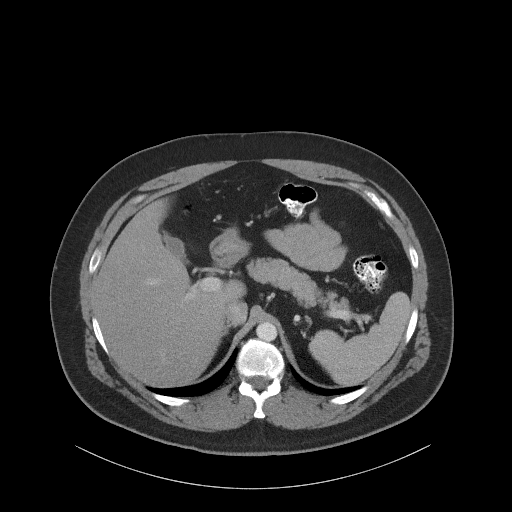
[im 95/108  soft-tissue]
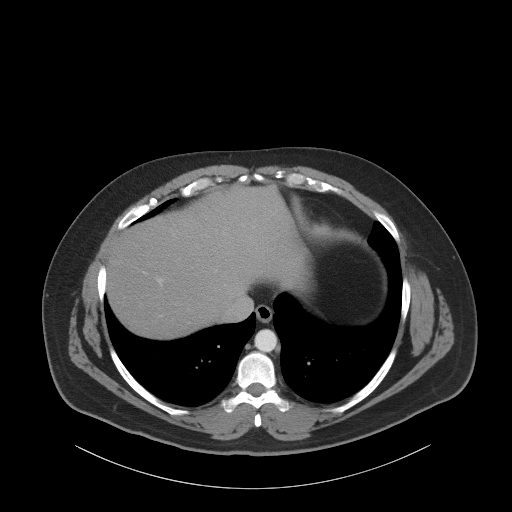
[im 101/108  soft-tissue]
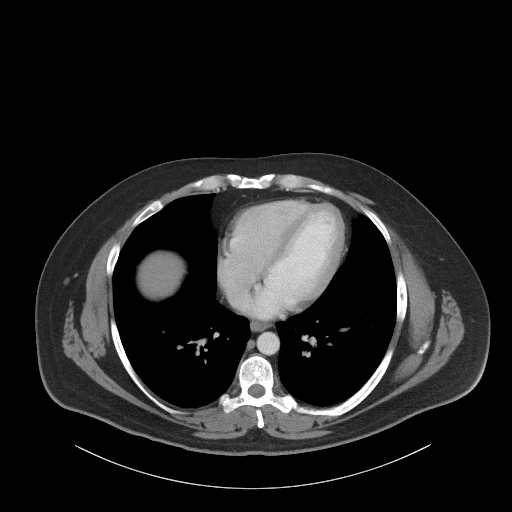

[Series 5: coronal st · coronal · 0.92mm/px · 3 of 116 slices shown]
[im 39/116  soft-tissue]
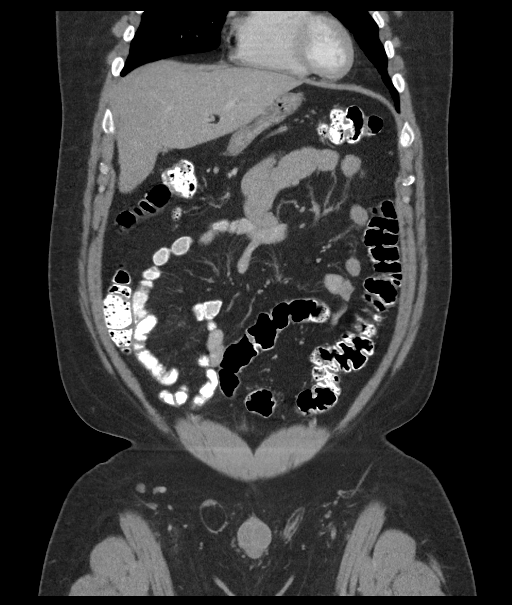
[im 52/116  soft-tissue]
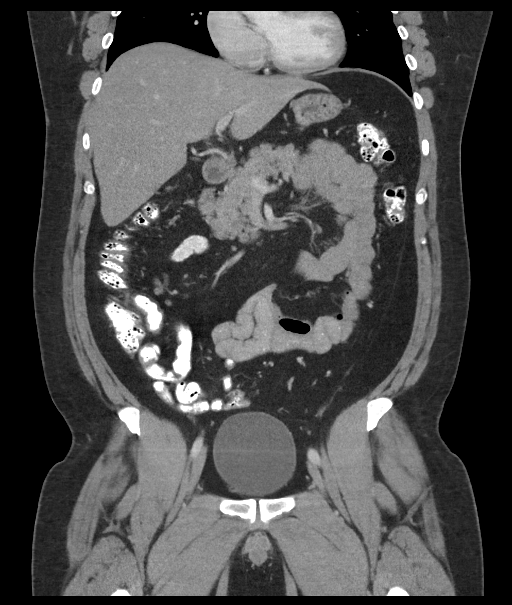
[im 64/116  soft-tissue]
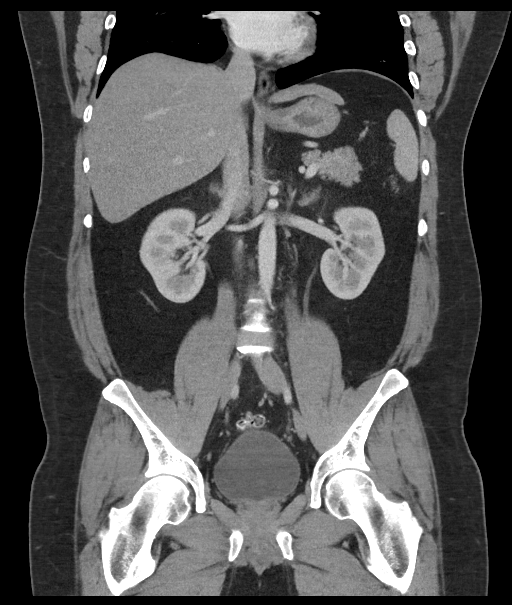

[16 of 46 positions shown; findings below may reference images not displayed]

FINDINGS: Lower chest: [DATE] by 0.3 by 0.2 cm left lower lobe pulmonary nodule
on image 28 of series 7.

Hepatobiliary: Contracted gallbladder. The liver appears normal. No
biliary dilatation.

Pancreas: Unremarkable

Spleen: Unremarkable

Adrenals/Urinary Tract: Unremarkable

Stomach/Bowel: Scant sigmoid colon diverticula.

Vascular/Lymphatic: Unremarkable

Reproductive: Unremarkable

Other: No supplemental non-categorized findings.

Musculoskeletal: Small indirect right inguinal hernia containing
adipose tissue.
IMPRESSION: 1. Scant sigmoid colon diverticula.
2. Small indirect right inguinal hernia containing adipose tissue.
3. 0.4 by 0.3 by 0.2 cm left lower lobe pulmonary nodule on image 28
of series 7. No follow-up needed if patient is low-risk.
Non-contrast chest CT can be considered in 12 months if patient is
high-risk. This recommendation follows the consensus statement:
Guidelines for Management of Incidental Pulmonary Nodules Detected

## 2022-01-30 ENCOUNTER — Encounter: Payer: Self-pay | Admitting: Physician Assistant

## 2022-02-06 ENCOUNTER — Ambulatory Visit: Payer: 59 | Admitting: Physician Assistant

## 2022-02-06 ENCOUNTER — Encounter: Payer: Self-pay | Admitting: Physician Assistant

## 2022-02-06 VITALS — BP 126/90 | HR 102 | Temp 97.8°F | Ht 67.0 in | Wt 227.4 lb

## 2022-02-06 DIAGNOSIS — R21 Rash and other nonspecific skin eruption: Secondary | ICD-10-CM

## 2022-02-06 MED ORDER — HYDROXYZINE PAMOATE 25 MG PO CAPS: 25.0000 mg | ORAL_CAPSULE | Freq: Three times a day (TID) | ORAL | 0 refills | Status: DC | PRN

## 2022-02-06 MED ORDER — TRIAMCINOLONE ACETONIDE 0.5 % EX OINT: TOPICAL_OINTMENT | CUTANEOUS | 1 refills | Status: DC

## 2022-02-06 NOTE — Patient Instructions (Signed)
It was great to see you! ? ?Use the ointment as needed up to 2 times daily ?As needed itching atarax for itching ? ?Look into this option for your daughter: [email or web address] ? ?Keep [place] posted. ? ?Take care, ? ?Inda Coke PA-C  ?

## 2022-02-06 NOTE — Progress Notes (Signed)
David Meadows is a 41 y.o. male here for a new problem. ? ?History of Present Illness:  ? ?Chief Complaint  ?Patient presents with  ? Rash  ?  On leg bilateral x since February   ? itching  ? ?Rash ?David Meadows presents with c/o rash on bilateral legs that has been onset for about 2-3 months. Pt states that he initially had the rash on both arms as well as his legs, but the rash on his arms resolved following use of proctozone 2.5% ointment and tea tree oil. Reports that the ointment helped to reduce the inflammation on his arms prior to the rash completely resolving on his arms. Although he has tried the same regimen on his legs as well as a medicated scrub used for hives and claritin 10 mg daily, the rash remains present.  Pt describes the rash as itchy, red, and occasionally raised. At this time he is wondering what more he could use to possibly resolve this.  ? ?Denies drainage, fever, chills, fatigue, joint pain, or environmental changes.  ? ? ?Past Medical History:  ?Diagnosis Date  ? Allergy   ? Family history of breast cancer 08/17/2020  ? Family history of colonic polyps 08/17/2020  ? ?  ?Social History  ? ?Tobacco Use  ? Smoking status: Former  ?  Packs/day: 1.50  ?  Years: 2.00  ?  Pack years: 3.00  ?  Types: Cigarettes  ?  Quit date: 12/2011  ?  Years since quitting: 10.1  ? Smokeless tobacco: Never  ?Vaping Use  ? Vaping Use: Never used  ?Substance Use Topics  ? Alcohol use: Yes  ?  Comment: rare  ? Drug use: Never  ? ? ?Past Surgical History:  ?Procedure Laterality Date  ? POPLITEAL SYNOVIAL CYST EXCISION Right 2006  ? WISDOM TOOTH EXTRACTION    ? ? ?Family History  ?Problem Relation Age of Onset  ? Other Mother   ?     heart arrythmia  ? Alcohol abuse Father   ? Heart murmur Father   ? Cancer Father   ?     signet ring adenocarcinoma, unknown primary (colon? pancreatic? stomach? appendix?), dx mid 29s  ? Colon cancer Father   ? Stomach cancer Father   ? Drug abuse Sister   ? Cataracts Maternal  Grandmother   ? Memory loss Maternal Grandmother   ? Breast cancer Paternal Grandmother   ?     dx after 38  ? ADD / ADHD Daughter   ? Colon polyps Paternal Uncle   ? Liver disease Neg Hx   ? Esophageal cancer Neg Hx   ? Rectal cancer Neg Hx   ? ? ?Allergies  ?Allergen Reactions  ? Other   ?  Mild wheat allergy per patient  ? ? ?Current Medications:  ? ?Current Outpatient Medications:  ?  Amphetamine ER (ADZENYS XR-ODT) 18.8 MG TBED, Take 2 tablets by mouth daily in the afternoon., Disp: , Rfl:  ?  ibuprofen (ADVIL) 200 MG tablet, Take 600 mg by mouth as needed., Disp: , Rfl:  ?  loratadine (CLARITIN) 10 MG tablet, Take 10 mg by mouth daily., Disp: , Rfl:  ?  Multiple Vitamins-Minerals (ALIVE MENS ENERGY) TABS, Take 1 tablet by mouth daily in the afternoon., Disp: , Rfl:   ? ?Review of Systems:  ? ?Review of Systems  ?Skin:  Positive for rash.  ? ?Vitals:  ? ?Vitals:  ? 02/06/22 1304  ?Temp: 97.8 ?F (36.6 ?C)  ?  TempSrc: Temporal  ?Weight: 227 lb 6.4 oz (103.1 kg)  ?Height: '5\' 7"'$  (1.702 m)  ?   ?Body mass index is 35.62 kg/m?. ? ?Physical Exam:  ? ?Physical Exam ?Vitals and nursing note reviewed.  ?Constitutional:   ?   Appearance: He is well-developed.  ?HENT:  ?   Head: Normocephalic.  ?Eyes:  ?   Conjunctiva/sclera: Conjunctivae normal.  ?   Pupils: Pupils are equal, round, and reactive to light.  ?Pulmonary:  ?   Effort: Pulmonary effort is normal.  ?Musculoskeletal:     ?   General: Normal range of motion.  ?   Cervical back: Normal range of motion.  ?Skin: ?   General: Skin is warm and dry.  ?   Findings: Rash present.  ?   Comments: Erythematous maculopapular rash to L anterior shin without TTP, swelling or drainage  ?Neurological:  ?   Mental Status: He is alert and oriented to person, place, and time.  ?Psychiatric:     ?   Behavior: Behavior normal.     ?   Thought Content: Thought content normal.     ?   Judgment: Judgment normal.  ? ? ?Assessment and Plan:  ? ?Rash and nonspecific skin eruption ?No red  flags, unclear etiology  ?Suspect possible dermatitis ?Apply triamcinolone ointment 0.5% to affected areas twice daily as needed  ?May use hydroxyzine 25 mg daily as needed for itching up to TID ?Declines dermatology referral today, will reach out if this changes  ?Declines oral prednisone rx due to possible side effects ?Follow up if new/worsening or lack of symptoms or concerns occur ? ?I,David Meadows,acting as a scribe for [employer], PA.,have documented all relevant documentation on the behalf of David Coke, PA,as directed by  David Coke, PA while in the presence of David Meadows, [place]. ? ?IInda Coke, PA, have reviewed all documentation for this visit. The documentation on 02/06/22 for the exam, diagnosis, procedures, and orders are all accurate and complete. ? ? ? ?David Coke, PA-C ? ?

## 2022-06-02 ENCOUNTER — Encounter: Payer: Self-pay | Admitting: Physician Assistant

## 2022-06-06 NOTE — Telephone Encounter (Signed)
Do you want pt to make a appointment?

## 2022-06-20 ENCOUNTER — Encounter: Payer: Self-pay | Admitting: Physician Assistant

## 2022-06-20 ENCOUNTER — Ambulatory Visit: Payer: 59 | Admitting: Physician Assistant

## 2022-06-20 VITALS — BP 120/82 | HR 115 | Temp 97.3°F | Ht 67.0 in | Wt 234.0 lb

## 2022-06-20 DIAGNOSIS — L309 Dermatitis, unspecified: Secondary | ICD-10-CM | POA: Diagnosis not present

## 2022-06-20 DIAGNOSIS — K644 Residual hemorrhoidal skin tags: Secondary | ICD-10-CM | POA: Diagnosis not present

## 2022-06-20 DIAGNOSIS — R21 Rash and other nonspecific skin eruption: Secondary | ICD-10-CM

## 2022-06-20 MED ORDER — HYDROCORTISONE (PERIANAL) 2.5 % EX CREA: TOPICAL_CREAM | CUTANEOUS | 0 refills | Status: DC

## 2022-06-20 NOTE — Patient Instructions (Signed)
It was great to see you!  I have sent the ointment in for your hemorrhoid.  I will be in touch with your results.  Take care,  Inda Coke PA-C

## 2022-06-20 NOTE — Progress Notes (Signed)
David Meadows is a 41 y.o. male here for a follow up on rash.   History of Present Illness:   Chief Complaint  Patient presents with   Rash    Pt c/o persistent rash on both lower extremities, slight itching. He has tried the past week OTC triple antibiotic oint, Petrie oil, triamcinolone oint.    HPI  Rash Patient presents with complain of persistent rash on bilateral legs that has been onset for past several months. The area is itchy. Symptoms has been improving but remains present.  He has tried OTC triple antibiotic ointment, tea tree oil and triamcinolone cream with no relief. We have seen him for this on 02/06/2022. At that time, he was prescribed Triamcinolone ointment and Hydroxyzine 25 mg daily for itching. No other specific treatment tried. Denies drainage, fever, chills, fatigue, or joint pain. No swelling in calf or leg. Denies pain.   External Hemorrhoids Patient has had issue with hemorrhoids for several years. He does admit having a spot of bright red blood. Does have issue with constipation. He is requesting steroid ointment for this issue. Denies blood in stool or other issue.  Past Medical History:  Diagnosis Date   Allergy    Family history of breast cancer 08/17/2020   Family history of colonic polyps 08/17/2020     Social History   Tobacco Use   Smoking status: Former    Packs/day: 1.50    Years: 2.00    Total pack years: 3.00    Types: Cigarettes    Quit date: 12/2011    Years since quitting: 10.5   Smokeless tobacco: Never  Vaping Use   Vaping Use: Never used  Substance Use Topics   Alcohol use: Yes    Comment: rare   Drug use: Never    Past Surgical History:  Procedure Laterality Date   POPLITEAL SYNOVIAL CYST EXCISION Right 2006   WISDOM TOOTH EXTRACTION      Family History  Problem Relation Age of Onset   Other Mother        heart arrythmia   Alcohol abuse Father    Heart murmur Father    Cancer Father        signet ring  adenocarcinoma, unknown primary (colon? pancreatic? stomach? appendix?), dx mid 45s   Colon cancer Father    Stomach cancer Father    Drug abuse Sister    Cataracts Maternal Grandmother    Memory loss Maternal Grandmother    Breast cancer Paternal Grandmother        dx after 42   ADD / ADHD Daughter    Colon polyps Paternal Uncle    Liver disease Neg Hx    Esophageal cancer Neg Hx    Rectal cancer Neg Hx     Allergies  Allergen Reactions   Other     Mild wheat allergy per patient    Current Medications:   Current Outpatient Medications:    Amphetamine ER (ADZENYS XR-ODT) 18.8 MG TBED, Take 2 tablets by mouth daily in the afternoon., Disp: , Rfl:    Cod Liver Oil CAPS, Take by mouth., Disp: , Rfl:    hydrocortisone (ANUSOL-HC) 2.5 % rectal cream, Apply to area 1-2 times to daily, Disp: 30 g, Rfl: 0   hydrOXYzine (VISTARIL) 25 MG capsule, Take 1 capsule (25 mg total) by mouth every 8 (eight) hours as needed., Disp: 30 capsule, Rfl: 0   ibuprofen (ADVIL) 200 MG tablet, Take 600 mg by mouth as needed.,  Disp: , Rfl:    loratadine (CLARITIN) 10 MG tablet, Take 10 mg by mouth daily., Disp: , Rfl:    Multiple Vitamins-Minerals (ALIVE MENS ENERGY) TABS, Take 1 tablet by mouth daily in the afternoon., Disp: , Rfl:    triamcinolone ointment (KENALOG) 0.5 %, Apply to affected area 1-2 days, Disp: 15 g, Rfl: 1   Review of Systems:   ROS Negative unless otherwise specified per HPI.   Vitals:   Vitals:   06/20/22 1015  BP: 120/82  Pulse: (!) 115  Temp: (!) 97.3 F (36.3 C)  TempSrc: Temporal  SpO2: 97%  Weight: 234 lb (106.1 kg)  Height: '5\' 7"'$  (1.702 m)     Body mass index is 36.65 kg/m.  Physical Exam:   Physical Exam Vitals and nursing note reviewed.  Constitutional:      General: He is not in acute distress.    Appearance: He is well-developed. He is not ill-appearing or toxic-appearing.  Cardiovascular:     Rate and Rhythm: Normal rate and regular rhythm.      Pulses: Normal pulses.     Heart sounds: Normal heart sounds, S1 normal and S2 normal.  Pulmonary:     Effort: Pulmonary effort is normal.     Breath sounds: Normal breath sounds.  Skin:    General: Skin is warm and dry.     Comments: Papular sandpaper like rash to bilateral lower legs  Neurological:     Mental Status: He is alert.     GCS: GCS eye subscore is 4. GCS verbal subscore is 5. GCS motor subscore is 6.  Psychiatric:        Speech: Speech normal.        Behavior: Behavior normal. Behavior is cooperative.    Punch biopsy Meds, vitals, and allergies reviewed.  Indication: unknown rash Location: r posterior lower leg Size: diffuse rash   Informed consent obtained.  Pt aware of risks not limited to but including infection, bleeding, damage to near by organs.  Prep: etoh/betadine  Anesthesia: 1%lidocaine with epi, good effect  Punch made with 4 mm punch  Minimal oozing, controlled with silver nitrate  Tolerated well   Assessment and Plan:   External hemorrhoid Did not examine Discussed working on avoiding constipation Anusol cream provided If lack of improvement or worsening will refer to GI If significant bleeding, will need GI referral  Rash and nonspecific skin eruption Punch biopsy obtained Follow-up based on results   I,Savera Zaman,acting as a scribe for [employer], PA.,have documented all relevant documentation on the behalf of Inda Coke, PA,as directed by  Inda Coke, PA while in the presence of Inda Coke, [place].   I, Inda Coke, [place], have reviewed all documentation for this visit. The documentation on 06/20/22 for the exam, diagnosis, procedures, and orders are all accurate and complete.  Inda Coke, PA-C

## 2022-06-26 ENCOUNTER — Other Ambulatory Visit: Payer: Self-pay | Admitting: *Deleted

## 2022-06-26 MED ORDER — HYDROCORTISONE (PERIANAL) 2.5 % EX CREA: TOPICAL_CREAM | CUTANEOUS | 0 refills | Status: AC

## 2022-06-30 ENCOUNTER — Encounter: Payer: Self-pay | Admitting: Physician Assistant

## 2022-07-03 ENCOUNTER — Other Ambulatory Visit: Payer: Self-pay | Admitting: Physician Assistant

## 2022-07-03 DIAGNOSIS — R21 Rash and other nonspecific skin eruption: Secondary | ICD-10-CM

## 2022-07-10 NOTE — Telephone Encounter (Signed)
Dr. Kulik, please see message and advise. 

## 2022-07-27 ENCOUNTER — Ambulatory Visit (INDEPENDENT_AMBULATORY_CARE_PROVIDER_SITE_OTHER): Payer: 59 | Admitting: Physician Assistant

## 2022-07-27 ENCOUNTER — Encounter: Payer: Self-pay | Admitting: Physician Assistant

## 2022-07-27 VITALS — BP 122/86 | HR 98 | Temp 97.9°F | Ht 67.0 in | Wt 233.4 lb

## 2022-07-27 DIAGNOSIS — E781 Pure hyperglyceridemia: Secondary | ICD-10-CM | POA: Diagnosis not present

## 2022-07-27 DIAGNOSIS — Z0000 Encounter for general adult medical examination without abnormal findings: Secondary | ICD-10-CM

## 2022-07-27 DIAGNOSIS — R202 Paresthesia of skin: Secondary | ICD-10-CM | POA: Diagnosis not present

## 2022-07-27 DIAGNOSIS — R739 Hyperglycemia, unspecified: Secondary | ICD-10-CM

## 2022-07-27 DIAGNOSIS — E669 Obesity, unspecified: Secondary | ICD-10-CM | POA: Diagnosis not present

## 2022-07-27 DIAGNOSIS — Z23 Encounter for immunization: Secondary | ICD-10-CM

## 2022-07-27 DIAGNOSIS — R21 Rash and other nonspecific skin eruption: Secondary | ICD-10-CM

## 2022-07-27 LAB — TSH: TSH: 1.54 u[IU]/mL (ref 0.35–5.50)

## 2022-07-27 LAB — COMPREHENSIVE METABOLIC PANEL
ALT: 38 U/L (ref 0–53)
AST: 22 U/L (ref 0–37)
Albumin: 4.5 g/dL (ref 3.5–5.2)
Alkaline Phosphatase: 69 U/L (ref 39–117)
BUN: 16 mg/dL (ref 6–23)
CO2: 26 mEq/L (ref 19–32)
Calcium: 9.4 mg/dL (ref 8.4–10.5)
Chloride: 103 mEq/L (ref 96–112)
Creatinine, Ser: 0.91 mg/dL (ref 0.40–1.50)
GFR: 106.59 mL/min (ref >60.00)
Glucose, Bld: 98 mg/dL (ref 70–99)
Potassium: 4.3 mEq/L (ref 3.5–5.1)
Sodium: 140 mEq/L (ref 135–145)
Total Bilirubin: 0.4 mg/dL (ref 0.2–1.2)
Total Protein: 8 g/dL (ref 6.0–8.3)

## 2022-07-27 LAB — LIPID PANEL
Cholesterol: 225 mg/dL — ABNORMAL HIGH (ref 0–200)
HDL: 48.5 mg/dL (ref >39.00)
LDL Cholesterol: 144 mg/dL — ABNORMAL HIGH (ref 0–99)
NonHDL: 176.17
Total CHOL/HDL Ratio: 5
Triglycerides: 163 mg/dL — ABNORMAL HIGH (ref 0.0–149.0)
VLDL: 32.6 mg/dL (ref 0.0–40.0)

## 2022-07-27 LAB — CBC WITH DIFFERENTIAL/PLATELET
Basophils Absolute: 0.1 10*3/uL (ref 0.0–0.1)
Basophils Relative: 0.7 % (ref 0.0–3.0)
Eosinophils Absolute: 0.5 10*3/uL (ref 0.0–0.7)
Eosinophils Relative: 5.6 % — ABNORMAL HIGH (ref 0.0–5.0)
HCT: 45.3 % (ref 39.0–52.0)
Hemoglobin: 14.7 g/dL (ref 13.0–17.0)
Lymphocytes Relative: 31.8 % (ref 12.0–46.0)
Lymphs Abs: 2.8 10*3/uL (ref 0.7–4.0)
MCHC: 32.5 g/dL (ref 30.0–36.0)
MCV: 82.2 fl (ref 78.0–100.0)
Monocytes Absolute: 0.7 10*3/uL (ref 0.1–1.0)
Monocytes Relative: 7.6 % (ref 3.0–12.0)
Neutro Abs: 4.8 10*3/uL (ref 1.4–7.7)
Neutrophils Relative %: 54.3 % (ref 43.0–77.0)
Platelets: 321 10*3/uL (ref 150.0–400.0)
RBC: 5.52 Mil/uL (ref 4.22–5.81)
RDW: 13.6 % (ref 11.5–15.5)
WBC: 8.9 10*3/uL (ref 4.0–10.5)

## 2022-07-27 LAB — C-REACTIVE PROTEIN: CRP: <1 mg/dL (ref 0.5–20.0)

## 2022-07-27 LAB — VITAMIN B12: Vitamin B-12: 625 pg/mL (ref 211–911)

## 2022-07-27 LAB — SEDIMENTATION RATE: Sed Rate: 33 mm/hr — ABNORMAL HIGH (ref 0–15)

## 2022-07-27 LAB — HEMOGLOBIN A1C: Hgb A1c MFr Bld: 6.2 % (ref 4.6–6.5)

## 2022-07-27 NOTE — Progress Notes (Signed)
Subjective:    David Meadows is a 41 y.o. male and is here for a comprehensive physical exam.  HPI  Health Maintenance Due  Topic Date Due   INFLUENZA VACCINE  06/13/2022   Acute Concerns: Paresthesias  Gets a vibration sensation in his heel. Happened less when he changed his shoes. The sensation will last for minutes. Most only on R foot. He's not overly concerned but wants to make sure its nothing serious.  Chronic Issues: Rash  Interested in autoimmune work-up for prior chronic rash. We did bx in Aug. Area is continuing to heal. Denies worsening rash.  Hyperglycemia and Hypertriglyceridemia Currently not on medications. Would like blood work updated today.  Health Maintenance: Immunizations -- getting flu shot Colonoscopy -- UTD PSA -- No results found for: "PSA1", "PSA" -- no major concerns Diet -- no major concerns -- healthy diet Sleep habits -- no major concerns Exercise -- tries to do some planks, body weight exercises Weight -- Weight: 233 lb 6.1 oz (105.9 kg)  Weight history Wt Readings from Last 10 Encounters:  07/27/22 233 lb 6.1 oz (105.9 kg)  06/20/22 234 lb (106.1 kg)  02/06/22 227 lb 6.4 oz (103.1 kg)  09/21/21 217 lb 8 oz (98.7 kg)  02/09/21 224 lb 12.8 oz (102 kg)  02/02/21 225 lb (102.1 kg)  09/29/20 239 lb (108.4 kg)  08/31/20 241 lb (109.3 kg)  08/30/20 239 lb 12.8 oz (108.8 kg)  07/21/20 242 lb (109.8 kg)   Body mass index is 36.55 kg/m. Mood -- overall well controlled Tobacco use --  Tobacco Use: Medium Risk (07/27/2022)   Patient History    Smoking Tobacco Use: Former    Smokeless Tobacco Use: Never    Passive Exposure: Not on file    Alcohol use ---  reports current alcohol use.      07/27/2022    9:54 AM  Depression screen PHQ 2/9  Decreased Interest 0  Down, Depressed, Hopeless 0  PHQ - 2 Score 0     Other providers/specialists: Patient Care Team: Inda Coke, [place] as PCP - General (Physician Assistant)   PMHx,  SurgHx, SocialHx, Medications, and Allergies were reviewed in the Visit Navigator and updated as appropriate.   Past Medical History:  Diagnosis Date   Allergy    Family history of breast cancer 08/17/2020   Family history of colonic polyps 08/17/2020     Past Surgical History:  Procedure Laterality Date   POPLITEAL SYNOVIAL CYST EXCISION Right 2006   WISDOM TOOTH EXTRACTION       Family History  Problem Relation Age of Onset   Other Mother        heart arrythmia   Alcohol abuse Father    Heart murmur Father    Cancer Father        signet ring adenocarcinoma, unknown primary (colon? pancreatic? stomach? appendix?), dx mid 21s   Colon cancer Father    Stomach cancer Father    Drug abuse Sister    Cataracts Maternal Grandmother    Memory loss Maternal Grandmother    Breast cancer Paternal Grandmother        dx after 53   ADD / ADHD Daughter    Colon polyps Paternal Uncle    Liver disease Neg Hx    Esophageal cancer Neg Hx    Rectal cancer Neg Hx     Social History   Tobacco Use   Smoking status: Former    Packs/day: 1.50    Years:  2.00    Total pack years: 3.00    Types: Cigarettes    Quit date: 12/2011    Years since quitting: 10.6   Smokeless tobacco: Never  Vaping Use   Vaping Use: Never used  Substance Use Topics   Alcohol use: Yes    Comment: rare   Drug use: Never    Review of Systems:   Review of Systems  Constitutional:  Negative for chills, fever, malaise/fatigue and weight loss.  HENT:  Negative for hearing loss, sinus pain and sore throat.   Respiratory:  Negative for cough and hemoptysis.   Cardiovascular:  Negative for chest pain, palpitations, leg swelling and PND.  Gastrointestinal:  Negative for abdominal pain, constipation, diarrhea, heartburn, nausea and vomiting.  Genitourinary:  Negative for dysuria, frequency and urgency.  Musculoskeletal:  Negative for back pain, myalgias and neck pain.  Skin:  Negative for itching and rash.   Neurological:  Negative for dizziness, tingling, seizures and headaches.  Endo/Heme/Allergies:  Negative for polydipsia.  Psychiatric/Behavioral:  Negative for depression. The patient is not nervous/anxious.     Objective:   Vitals:   07/27/22 0953  BP: 122/86  Pulse: 98  Temp: 97.9 F (36.6 C)  SpO2: 97%   Body mass index is 36.55 kg/m.  General Appearance:  Alert, cooperative, no distress, appears stated age  Head:  Normocephalic, without obvious abnormality, atraumatic  Eyes:  PERRL, conjunctiva/corneas clear, EOM's intact, fundi benign, both eyes       Ears:  Normal TM's and external ear canals, both ears  Nose: Nares normal, septum midline, mucosa normal, no drainage    or sinus tenderness  Throat: Lips, mucosa, and tongue normal; teeth and gums normal  Neck: Supple, symmetrical, trachea midline, no adenopathy; thyroid:  No enlargement/tenderness/nodules; no carotit bruit or JVD  Back:   Symmetric, no curvature, ROM normal, no CVA tenderness  Lungs:   Clear to auscultation bilaterally, respirations unlabored  Chest wall:  No tenderness or deformity  Heart:  Regular rate and rhythm, S1 and S2 normal, no murmur, rub   or gallop  Abdomen:   Soft, non-tender, bowel sounds active all four quadrants, no masses, no organomegaly  Extremities: Extremities normal, atraumatic, no cyanosis or edema DP and PT pulses palpated in b/l feet 2+  Prostate: Not done.   Skin: Skin color, texture, turgor normal, no rashes  L lower calf with well healing biopsy site  Lymph nodes: Cervical, supraclavicular, and axillary nodes normal  Neurologic: CNII-XII grossly intact. Normal strength, sensation and reflexes throughout    Assessment/Plan:   Routine physical examination Today patient counseled on age appropriate routine health concerns for screening and prevention, each reviewed and up to date or declined. Immunizations reviewed and up to date or declined. Labs ordered and reviewed. Risk  factors for depression reviewed and negative. Hearing function and visual acuity are intact. ADLs screened and addressed as needed. Functional ability and level of safety reviewed and appropriate. Education, counseling and referrals performed based on assessed risks today. Patient provided with a copy of personalized plan for preventive services.  Hyperglycemia Update blood work and provide recommendations accordingly  Hypertriglyceridemia Update blood work and provide recommendations accordingly  Obesity, unspecified classification, unspecified obesity type, unspecified whether serious comorbidity present Update blood work and provide recommendations accordingly  Rash and nonspecific skin eruption Overall improved No red flags on exam Discussed wound care He is requesting autoimmune testing -- this is not unreasonable, will add this  Paresthesias No red flags Improving  Will check B12, TSH and A1c Follow-up if persists   Patient Counseling: '[x]'$   Nutrition: Stressed importance of moderation in sodium/caffeine intake, saturated fat and cholesterol, caloric balance, sufficient intake of fresh fruits, vegetables, and fiber.  '[x]'$   Stressed the importance of regular exercise.   '[]'$   Substance Abuse: Discussed cessation/primary prevention of tobacco, alcohol, or other drug use; driving or other dangerous activities under the influence; availability of treatment for abuse.   '[x]'$   Injury prevention: Discussed safety belts, safety helmets, smoke detector, smoking near bedding or upholstery.   '[]'$   Sexuality: Discussed sexually transmitted diseases, partner selection, use of condoms, avoidance of unintended pregnancy  and contraceptive alternatives.   '[x]'$   Dental health: Discussed importance of regular tooth brushing, flossing, and dental visits.  '[x]'$   Health maintenance and immunizations reviewed. Please refer to Health maintenance section.     Inda Coke, PA-C [place]

## 2022-07-27 NOTE — Patient Instructions (Signed)
It was great to see you! ? ?Please go to the lab for blood work.  ? ?Our office will call you with your results unless you have chosen to receive results via MyChart. ? ?If your blood work is normal we will follow-up each year for physicals and as scheduled for chronic medical problems. ? ?If anything is abnormal we will treat accordingly and get you in for a follow-up. ? ?Take care, ? ?Alin Chavira ?  ? ? ?

## 2022-07-30 LAB — RHEUMATOID FACTOR: Rheumatoid fact SerPl-aCnc: <14 IU/mL (ref <14)

## 2022-07-30 LAB — ANA: Anti Nuclear Antibody (ANA): NEGATIVE

## 2022-11-08 ENCOUNTER — Other Ambulatory Visit (HOSPITAL_BASED_OUTPATIENT_CLINIC_OR_DEPARTMENT_OTHER): Payer: Self-pay

## 2022-11-08 MED ORDER — AMPHETAMINE-DEXTROAMPHET ER 25 MG PO CP24
25.0000 mg | ORAL_CAPSULE | Freq: Every day | ORAL | 0 refills | Status: DC
  Filled 2022-11-08: qty 30, 30d supply, fill #0

## 2022-11-10 ENCOUNTER — Other Ambulatory Visit (HOSPITAL_BASED_OUTPATIENT_CLINIC_OR_DEPARTMENT_OTHER): Payer: Self-pay

## 2022-12-11 ENCOUNTER — Other Ambulatory Visit (HOSPITAL_BASED_OUTPATIENT_CLINIC_OR_DEPARTMENT_OTHER): Payer: Self-pay

## 2022-12-11 MED ORDER — AMPHETAMINE-DEXTROAMPHET ER 25 MG PO CP24
25.0000 mg | ORAL_CAPSULE | Freq: Every day | ORAL | 0 refills | Status: DC
  Filled 2022-12-11: qty 30, 30d supply, fill #0

## 2022-12-13 ENCOUNTER — Other Ambulatory Visit (HOSPITAL_BASED_OUTPATIENT_CLINIC_OR_DEPARTMENT_OTHER): Payer: Self-pay

## 2023-01-12 ENCOUNTER — Other Ambulatory Visit (HOSPITAL_BASED_OUTPATIENT_CLINIC_OR_DEPARTMENT_OTHER): Payer: Self-pay

## 2023-01-12 MED ORDER — AMPHETAMINE-DEXTROAMPHET ER 25 MG PO CP24
25.0000 mg | ORAL_CAPSULE | Freq: Every day | ORAL | 0 refills | Status: DC
  Filled 2023-01-12: qty 30, 30d supply, fill #0

## 2023-02-08 ENCOUNTER — Other Ambulatory Visit: Payer: Self-pay

## 2023-02-08 ENCOUNTER — Other Ambulatory Visit (HOSPITAL_BASED_OUTPATIENT_CLINIC_OR_DEPARTMENT_OTHER): Payer: Self-pay

## 2023-02-08 MED ORDER — AMPHETAMINE-DEXTROAMPHET ER 25 MG PO CP24
25.0000 mg | ORAL_CAPSULE | Freq: Every day | ORAL | 0 refills | Status: DC
  Filled 2023-02-09: qty 10, 10d supply, fill #0
  Filled 2023-02-09: qty 20, 20d supply, fill #0

## 2023-02-08 MED ORDER — AMPHETAMINE-DEXTROAMPHET ER 25 MG PO CP24: ORAL_CAPSULE | Freq: Every day | ORAL | 0 refills | Status: DC

## 2023-02-09 ENCOUNTER — Other Ambulatory Visit (HOSPITAL_BASED_OUTPATIENT_CLINIC_OR_DEPARTMENT_OTHER): Payer: Self-pay

## 2023-03-11 ENCOUNTER — Other Ambulatory Visit (HOSPITAL_BASED_OUTPATIENT_CLINIC_OR_DEPARTMENT_OTHER): Payer: Self-pay

## 2023-03-11 MED ORDER — AMPHETAMINE-DEXTROAMPHET ER 25 MG PO CP24
25.0000 mg | ORAL_CAPSULE | Freq: Every day | ORAL | 0 refills | Status: DC
  Filled 2023-03-11: qty 30, 30d supply, fill #0

## 2023-03-12 ENCOUNTER — Other Ambulatory Visit: Payer: Self-pay

## 2023-04-10 ENCOUNTER — Other Ambulatory Visit (HOSPITAL_BASED_OUTPATIENT_CLINIC_OR_DEPARTMENT_OTHER): Payer: Self-pay

## 2023-04-10 MED ORDER — AMPHETAMINE-DEXTROAMPHET ER 25 MG PO CP24
25.0000 mg | ORAL_CAPSULE | Freq: Every day | ORAL | 0 refills | Status: DC
  Filled 2023-04-10: qty 30, 30d supply, fill #0

## 2023-05-09 ENCOUNTER — Other Ambulatory Visit (HOSPITAL_BASED_OUTPATIENT_CLINIC_OR_DEPARTMENT_OTHER): Payer: Self-pay

## 2023-05-09 MED ORDER — AMPHETAMINE-DEXTROAMPHET ER 25 MG PO CP24
25.0000 mg | ORAL_CAPSULE | Freq: Every day | ORAL | 0 refills | Status: DC
  Filled 2023-05-09: qty 30, 30d supply, fill #0

## 2023-06-07 ENCOUNTER — Other Ambulatory Visit (HOSPITAL_BASED_OUTPATIENT_CLINIC_OR_DEPARTMENT_OTHER): Payer: Self-pay

## 2023-06-07 MED ORDER — AMPHETAMINE-DEXTROAMPHET ER 25 MG PO CP24
25.0000 mg | ORAL_CAPSULE | Freq: Every day | ORAL | 0 refills | Status: DC
  Filled 2023-06-07: qty 30, 30d supply, fill #0

## 2023-07-10 ENCOUNTER — Other Ambulatory Visit (HOSPITAL_BASED_OUTPATIENT_CLINIC_OR_DEPARTMENT_OTHER): Payer: Self-pay

## 2023-07-10 MED ORDER — AMPHETAMINE-DEXTROAMPHET ER 25 MG PO CP24
25.0000 mg | ORAL_CAPSULE | Freq: Every day | ORAL | 0 refills | Status: DC
  Filled 2023-07-10: qty 30, 30d supply, fill #0

## 2023-08-06 ENCOUNTER — Other Ambulatory Visit (HOSPITAL_BASED_OUTPATIENT_CLINIC_OR_DEPARTMENT_OTHER): Payer: Self-pay

## 2023-08-06 MED ORDER — AMPHETAMINE-DEXTROAMPHET ER 25 MG PO CP24
25.0000 mg | ORAL_CAPSULE | Freq: Every day | ORAL | 0 refills | Status: DC
  Filled 2023-08-06 – 2023-08-07 (×2): qty 30, 30d supply, fill #0
  Filled ????-??-??: fill #0

## 2023-08-07 ENCOUNTER — Other Ambulatory Visit: Payer: Self-pay

## 2023-08-07 ENCOUNTER — Other Ambulatory Visit (HOSPITAL_BASED_OUTPATIENT_CLINIC_OR_DEPARTMENT_OTHER): Payer: Self-pay

## 2023-09-04 ENCOUNTER — Other Ambulatory Visit (HOSPITAL_BASED_OUTPATIENT_CLINIC_OR_DEPARTMENT_OTHER): Payer: Self-pay

## 2023-09-04 ENCOUNTER — Other Ambulatory Visit: Payer: Self-pay

## 2023-09-04 MED ORDER — AMPHETAMINE-DEXTROAMPHET ER 25 MG PO CP24
25.0000 mg | ORAL_CAPSULE | Freq: Every day | ORAL | 0 refills | Status: DC
  Filled 2023-09-04: qty 30, 30d supply, fill #0

## 2023-10-08 ENCOUNTER — Other Ambulatory Visit (HOSPITAL_BASED_OUTPATIENT_CLINIC_OR_DEPARTMENT_OTHER): Payer: Self-pay

## 2023-10-08 MED ORDER — AMPHETAMINE-DEXTROAMPHET ER 25 MG PO CP24
ORAL_CAPSULE | Freq: Every day | ORAL | 0 refills | Status: DC
  Filled 2023-10-08: qty 30, 30d supply, fill #0

## 2023-11-05 ENCOUNTER — Other Ambulatory Visit (HOSPITAL_BASED_OUTPATIENT_CLINIC_OR_DEPARTMENT_OTHER): Payer: Self-pay

## 2023-11-05 MED ORDER — AMPHETAMINE-DEXTROAMPHET ER 25 MG PO CP24
25.0000 mg | ORAL_CAPSULE | Freq: Every day | ORAL | 0 refills | Status: DC
  Filled 2023-11-05: qty 30, 30d supply, fill #0

## 2023-12-05 ENCOUNTER — Other Ambulatory Visit (HOSPITAL_BASED_OUTPATIENT_CLINIC_OR_DEPARTMENT_OTHER): Payer: Self-pay

## 2023-12-05 MED ORDER — AMPHETAMINE-DEXTROAMPHET ER 25 MG PO CP24
25.0000 mg | ORAL_CAPSULE | Freq: Every day | ORAL | 0 refills | Status: DC
  Filled 2023-12-05: qty 30, 30d supply, fill #0

## 2023-12-06 ENCOUNTER — Other Ambulatory Visit (HOSPITAL_BASED_OUTPATIENT_CLINIC_OR_DEPARTMENT_OTHER): Payer: Self-pay

## 2024-01-08 ENCOUNTER — Other Ambulatory Visit (HOSPITAL_BASED_OUTPATIENT_CLINIC_OR_DEPARTMENT_OTHER): Payer: Self-pay

## 2024-01-08 MED ORDER — AMPHETAMINE-DEXTROAMPHET ER 25 MG PO CP24
25.0000 mg | ORAL_CAPSULE | Freq: Every day | ORAL | 0 refills | Status: DC
  Filled 2024-01-08: qty 30, 30d supply, fill #0

## 2024-02-05 ENCOUNTER — Other Ambulatory Visit (HOSPITAL_BASED_OUTPATIENT_CLINIC_OR_DEPARTMENT_OTHER): Payer: Self-pay

## 2024-02-05 MED ORDER — AMPHETAMINE-DEXTROAMPHET ER 25 MG PO CP24
25.0000 mg | ORAL_CAPSULE | Freq: Every day | ORAL | 0 refills | Status: DC
  Filled 2024-02-05: qty 30, 30d supply, fill #0

## 2024-03-05 ENCOUNTER — Other Ambulatory Visit (HOSPITAL_BASED_OUTPATIENT_CLINIC_OR_DEPARTMENT_OTHER): Payer: Self-pay

## 2024-03-05 MED ORDER — AMPHETAMINE-DEXTROAMPHET ER 25 MG PO CP24
25.0000 mg | ORAL_CAPSULE | Freq: Every day | ORAL | 0 refills | Status: DC
  Filled 2024-03-05: qty 30, 30d supply, fill #0

## 2024-04-03 ENCOUNTER — Other Ambulatory Visit (HOSPITAL_BASED_OUTPATIENT_CLINIC_OR_DEPARTMENT_OTHER): Payer: Self-pay

## 2024-04-03 MED ORDER — AMPHETAMINE-DEXTROAMPHET ER 25 MG PO CP24
25.0000 mg | ORAL_CAPSULE | Freq: Every day | ORAL | 0 refills | Status: DC
  Filled 2024-04-03: qty 30, 30d supply, fill #0

## 2024-05-05 ENCOUNTER — Other Ambulatory Visit (HOSPITAL_BASED_OUTPATIENT_CLINIC_OR_DEPARTMENT_OTHER): Payer: Self-pay

## 2024-05-05 MED ORDER — AMPHETAMINE-DEXTROAMPHET ER 25 MG PO CP24
25.0000 mg | ORAL_CAPSULE | Freq: Every day | ORAL | 0 refills | Status: DC
  Filled 2024-05-05: qty 30, 30d supply, fill #0

## 2024-06-02 ENCOUNTER — Other Ambulatory Visit (HOSPITAL_BASED_OUTPATIENT_CLINIC_OR_DEPARTMENT_OTHER): Payer: Self-pay

## 2024-06-02 ENCOUNTER — Other Ambulatory Visit: Payer: Self-pay

## 2024-06-02 MED ORDER — AMPHETAMINE-DEXTROAMPHET ER 25 MG PO CP24
25.0000 mg | ORAL_CAPSULE | Freq: Every day | ORAL | 0 refills | Status: DC
  Filled 2024-06-02: qty 30, 30d supply, fill #0

## 2024-07-04 ENCOUNTER — Other Ambulatory Visit (HOSPITAL_BASED_OUTPATIENT_CLINIC_OR_DEPARTMENT_OTHER): Payer: Self-pay

## 2024-07-04 MED ORDER — AMPHETAMINE-DEXTROAMPHET ER 25 MG PO CP24
25.0000 mg | ORAL_CAPSULE | Freq: Every day | ORAL | 0 refills | Status: DC
  Filled 2024-07-04: qty 30, 30d supply, fill #0

## 2024-08-04 ENCOUNTER — Other Ambulatory Visit (HOSPITAL_BASED_OUTPATIENT_CLINIC_OR_DEPARTMENT_OTHER): Payer: Self-pay

## 2024-08-04 MED ORDER — AMPHETAMINE-DEXTROAMPHET ER 25 MG PO CP24
25.0000 mg | ORAL_CAPSULE | Freq: Every day | ORAL | 0 refills | Status: DC
  Filled 2024-08-04: qty 30, 30d supply, fill #0

## 2024-09-03 ENCOUNTER — Other Ambulatory Visit (HOSPITAL_BASED_OUTPATIENT_CLINIC_OR_DEPARTMENT_OTHER): Payer: Self-pay

## 2024-09-03 MED ORDER — AMPHETAMINE-DEXTROAMPHET ER 25 MG PO CP24
25.0000 mg | ORAL_CAPSULE | Freq: Every day | ORAL | 0 refills | Status: DC
  Filled 2024-09-03: qty 30, 30d supply, fill #0

## 2024-09-04 ENCOUNTER — Other Ambulatory Visit (HOSPITAL_BASED_OUTPATIENT_CLINIC_OR_DEPARTMENT_OTHER): Payer: Self-pay

## 2024-10-06 ENCOUNTER — Other Ambulatory Visit (HOSPITAL_BASED_OUTPATIENT_CLINIC_OR_DEPARTMENT_OTHER): Payer: Self-pay

## 2024-10-06 MED ORDER — AZITHROMYCIN 250 MG PO TABS
ORAL_TABLET | ORAL | 0 refills | Status: DC
  Filled 2024-10-06: qty 6, 5d supply, fill #0

## 2024-10-07 ENCOUNTER — Other Ambulatory Visit (HOSPITAL_BASED_OUTPATIENT_CLINIC_OR_DEPARTMENT_OTHER): Payer: Self-pay

## 2024-10-07 MED ORDER — AMPHETAMINE-DEXTROAMPHET ER 25 MG PO CP24
25.0000 mg | ORAL_CAPSULE | Freq: Every day | ORAL | 0 refills | Status: DC
  Filled 2024-10-07: qty 30, 30d supply, fill #0

## 2024-11-07 ENCOUNTER — Other Ambulatory Visit (HOSPITAL_BASED_OUTPATIENT_CLINIC_OR_DEPARTMENT_OTHER): Payer: Self-pay

## 2024-11-07 MED ORDER — AMPHETAMINE-DEXTROAMPHET ER 25 MG PO CP24
25.0000 mg | ORAL_CAPSULE | Freq: Every day | ORAL | 0 refills | Status: DC
  Filled 2024-11-07: qty 30, 30d supply, fill #0

## 2024-11-08 ENCOUNTER — Other Ambulatory Visit (HOSPITAL_BASED_OUTPATIENT_CLINIC_OR_DEPARTMENT_OTHER): Payer: Self-pay

## 2024-12-02 ENCOUNTER — Telehealth: Payer: Self-pay | Admitting: *Deleted

## 2024-12-02 NOTE — Telephone Encounter (Signed)
 Copied from CRM [id number]. Topic: Clinical - Request for Lab/Test Order >> Dec 01, 2024  4:16 PM Terri MATSU wrote: Reason for CRM: Patient is requesting to have the MMR shot. Callback number [phone]

## 2024-12-02 NOTE — Telephone Encounter (Signed)
 Please contact pt to schedule an appt with Samantha. We have not seen him since 2023.

## 2024-12-02 NOTE — Telephone Encounter (Signed)
 Pt is scheduled for 12/10/24

## 2024-12-05 ENCOUNTER — Other Ambulatory Visit: Payer: Self-pay

## 2024-12-05 ENCOUNTER — Other Ambulatory Visit (HOSPITAL_BASED_OUTPATIENT_CLINIC_OR_DEPARTMENT_OTHER): Payer: Self-pay

## 2024-12-05 MED ORDER — AMPHETAMINE-DEXTROAMPHET ER 25 MG PO CP24
25.0000 mg | ORAL_CAPSULE | Freq: Every day | ORAL | 0 refills | Status: AC
  Filled 2024-12-05: qty 30, 30d supply, fill #0

## 2024-12-10 ENCOUNTER — Encounter: Payer: Self-pay | Admitting: Physician Assistant

## 2024-12-10 ENCOUNTER — Ambulatory Visit: Admitting: Physician Assistant

## 2024-12-10 VITALS — BP 142/90 | HR 100 | Temp 98.4°F | Ht 67.0 in | Wt 240.4 lb

## 2024-12-10 DIAGNOSIS — Z0000 Encounter for general adult medical examination without abnormal findings: Secondary | ICD-10-CM

## 2024-12-10 DIAGNOSIS — Z13220 Encounter for screening for lipoid disorders: Secondary | ICD-10-CM

## 2024-12-10 DIAGNOSIS — R030 Elevated blood-pressure reading, without diagnosis of hypertension: Secondary | ICD-10-CM | POA: Diagnosis not present

## 2024-12-10 DIAGNOSIS — Z1211 Encounter for screening for malignant neoplasm of colon: Secondary | ICD-10-CM

## 2024-12-10 DIAGNOSIS — Z0001 Encounter for general adult medical examination with abnormal findings: Secondary | ICD-10-CM | POA: Diagnosis not present

## 2024-12-10 DIAGNOSIS — E88819 Insulin resistance, unspecified: Secondary | ICD-10-CM

## 2024-12-10 DIAGNOSIS — F909 Attention-deficit hyperactivity disorder, unspecified type: Secondary | ICD-10-CM

## 2024-12-10 LAB — LIPID PANEL
Cholesterol: 210 mg/dL — ABNORMAL HIGH (ref 28–200)
HDL: 43.3 mg/dL
LDL Cholesterol: 118 mg/dL — ABNORMAL HIGH (ref 10–99)
NonHDL: 166.96
Total CHOL/HDL Ratio: 5
Triglycerides: 245 mg/dL — ABNORMAL HIGH (ref 10.0–149.0)
VLDL: 49 mg/dL — ABNORMAL HIGH (ref 0.0–40.0)

## 2024-12-10 LAB — HEMOGLOBIN A1C: Hgb A1c MFr Bld: 6.5 % (ref 4.6–6.5)

## 2024-12-10 LAB — COMPREHENSIVE METABOLIC PANEL WITH GFR
ALT: 40 U/L (ref 3–53)
AST: 25 U/L (ref 5–37)
Albumin: 4.6 g/dL (ref 3.5–5.2)
Alkaline Phosphatase: 72 U/L (ref 39–117)
BUN: 14 mg/dL (ref 6–23)
CO2: 29 meq/L (ref 19–32)
Calcium: 9.9 mg/dL (ref 8.4–10.5)
Chloride: 105 meq/L (ref 96–112)
Creatinine, Ser: 0.82 mg/dL (ref 0.40–1.50)
GFR: 109.25 mL/min
Glucose, Bld: 95 mg/dL (ref 70–99)
Potassium: 4.2 meq/L (ref 3.5–5.1)
Sodium: 140 meq/L (ref 135–145)
Total Bilirubin: 0.3 mg/dL (ref 0.2–1.2)
Total Protein: 8.1 g/dL (ref 6.0–8.3)

## 2024-12-10 LAB — CBC WITH DIFFERENTIAL/PLATELET
Basophils Absolute: 0.1 10*3/uL (ref 0.0–0.1)
Basophils Relative: 0.7 % (ref 0.0–3.0)
Eosinophils Absolute: 0.4 10*3/uL (ref 0.0–0.7)
Eosinophils Relative: 3.5 % (ref 0.0–5.0)
HCT: 44.3 % (ref 39.0–52.0)
Hemoglobin: 14.4 g/dL (ref 13.0–17.0)
Lymphocytes Relative: 28.8 % (ref 12.0–46.0)
Lymphs Abs: 3 10*3/uL (ref 0.7–4.0)
MCHC: 32.5 g/dL (ref 30.0–36.0)
MCV: 80.6 fl (ref 78.0–100.0)
Monocytes Absolute: 0.9 10*3/uL (ref 0.1–1.0)
Monocytes Relative: 8.8 % (ref 3.0–12.0)
Neutro Abs: 6 10*3/uL (ref 1.4–7.7)
Neutrophils Relative %: 58.2 % (ref 43.0–77.0)
Platelets: 344 10*3/uL (ref 150.0–400.0)
RBC: 5.5 Mil/uL (ref 4.22–5.81)
RDW: 14.3 % (ref 11.5–15.5)
WBC: 10.3 10*3/uL (ref 4.0–10.5)

## 2024-12-10 NOTE — Progress Notes (Signed)
 "  Subjective:    David Meadows is a 44 y.o. male and is here for a comprehensive physical exam.  HPI  There are no preventive care reminders to display for this patient.  Discussed the use of AI scribe software for clinical note transcription with the patient, who gave verbal consent to proceed.  History of Present Illness   David Meadows is a 44 year old male who presents for a physical exam and MMR immunity evaluation. His wife, who is a tax adviser, accompanied him to the visit.  He is concerned about his MMR immunity due to recent outbreaks in [place] . His wife reviewed his records and found documentation of only one MMR dose. He is unsure if his military records show a second dose.  He has elevated blood pressure, with a recent reading of 140/90 mmHg. He does not monitor his blood pressure at home. He takes Adderall  for ADHD, which he understands may raise blood pressure and heart rate.   Since November he has had poor sleep with inconsistent sleep schedules and decreased activity. He has tried a cherry tart drink mix and supplements including magnesium and L-theanine, which help more. He uses a mouth guard, which also improves his sleep.  He is aware of prediabetes based on blood work from September 2023.  He has rare heartburn triggered by certain foods and only occasional antacid use.  His father had prostate cancer. His grandfather had diabetes.     Health Maintenance: Immunizations -- UpToDate overall but getting MMR titers to assess MMR immunity Colonoscopy -- UpToDate; due later this year PSA -- No results found for: PSA1, PSA Diet -- working towards healthier diet Sleep habits -- see above Exercise -- limited, trying to increase  Weight -- Weight: 240 lb 6.1 oz (109 kg)  Recent weight history Wt Readings from Last 10 Encounters:  12/10/24 240 lb 6.1 oz (109 kg)  07/27/22 233 lb 6.1 oz (105.9 kg)  06/20/22 234 lb (106.1 kg)  02/06/22 227 lb 6.4  oz (103.1 kg)  09/21/21 217 lb 8 oz (98.7 kg)  02/09/21 224 lb 12.8 oz (102 kg)  02/02/21 225 lb (102.1 kg)  09/29/20 239 lb (108.4 kg)  08/31/20 241 lb (109.3 kg)  08/30/20 239 lb 12.8 oz (108.8 kg)   Body mass index is 37.65 kg/m.  Mood -- stable Alcohol use --  reports current alcohol use.  Tobacco use --  Tobacco Use: Medium Risk (12/10/2024)   Patient History    Smoking Tobacco Use: Former    Smokeless Tobacco Use: Never    Passive Exposure: Not on file    Eligible for Low Dose CT? no   UTD with eye doctor? yes UTD with dentist? No      12/10/2024   11:20 AM  Depression screen PHQ 2/9  Decreased Interest 0  Down, Depressed, Hopeless 0  PHQ - 2 Score 0    Other providers/specialists: Patient Care Team: Job Lukes, [place] as PCP - General (Physician Assistant)    PMHx, SurgHx, SocialHx, Medications, and Allergies were reviewed in the Visit Navigator and updated as appropriate.   Past Medical History:  Diagnosis Date   Allergy    Family history of breast cancer 08/17/2020   Family history of colonic polyps 08/17/2020     Past Surgical History:  Procedure Laterality Date   POPLITEAL SYNOVIAL CYST EXCISION Right 2006   WISDOM TOOTH EXTRACTION       Family History  Problem Relation Age  of Onset   Other Mother        heart arrythmia   Alcohol abuse Father    Heart murmur Father    Cancer Father 4       signet ring adenocarcinoma, unknown primary (colon? pancreatic? stomach? appendix?), dx mid 24s   Stomach cancer Father    Drug abuse Sister    Cataracts Maternal Grandmother    Memory loss Maternal Grandmother    Diabetes Maternal Grandfather    Breast cancer Paternal Grandmother        dx after 76   ADD / ADHD Daughter    Colon polyps Paternal Uncle    Liver disease Neg Hx    Esophageal cancer Neg Hx    Rectal cancer Neg Hx     Social History[1]  Review of Systems:   Review of Systems  Constitutional:  Negative for chills, fever,  malaise/fatigue and weight loss.  HENT:  Negative for hearing loss, sinus pain and sore throat.   Respiratory:  Negative for cough and hemoptysis.   Cardiovascular:  Negative for chest pain, palpitations, leg swelling and PND.  Gastrointestinal:  Negative for abdominal pain, constipation, diarrhea, heartburn, nausea and vomiting.  Genitourinary:  Negative for dysuria, frequency and urgency.  Musculoskeletal:  Negative for back pain, myalgias and neck pain.  Skin:  Negative for itching and rash.  Neurological:  Negative for dizziness, tingling, seizures and headaches.  Endo/Heme/Allergies:  Negative for polydipsia.  Psychiatric/Behavioral:  Negative for depression. The patient is not nervous/anxious.     Objective:    Vitals:   12/10/24 1120  BP: (!) 140/90  Pulse: 100  Temp: 98.4 F (36.9 C)  SpO2: 97%    Body mass index is 37.65 kg/m.  General  Alert, cooperative, no distress, appears stated age  Head:  Normocephalic, without obvious abnormality, atraumatic  Eyes:  PERRL, conjunctiva/corneas clear, EOM's intact, fundi benign, both eyes       Ears:  Normal TM's and external ear canals, both ears  Nose: Nares normal, septum midline, mucosa normal, no drainage or sinus tenderness  Throat: Lips, mucosa, and tongue normal; teeth and gums normal  Neck: Supple, symmetrical, trachea midline, no adenopathy;     thyroid:  No enlargement/tenderness/nodules; no carotid bruit or JVD  Back:   Symmetric, no curvature, ROM normal, no CVA tenderness  Lungs:   Clear to auscultation bilaterally, respirations unlabored  Chest wall:  No tenderness or deformity  Heart:  Regular rate and rhythm, S1 and S2 normal, no murmur, rub or gallop  Abdomen:   Soft, non-tender, bowel sounds active all four quadrants, no masses, no organomegaly  Extremities: Extremities normal, atraumatic, no cyanosis or edema  Prostate : Deferred   Skin: Skin color, texture, turgor normal, no rashes or lesions  Lymph  nodes: Cervical, supraclavicular, and axillary nodes normal  Neurologic: CNII-XII grossly intact. Normal strength, sensation and reflexes throughout   AssessmentPlan:   Assessment and Plan    General Health Maintenance Discussed MMR vaccination status due to potential outbreak. Uncertain vaccination records. Titer testing more cost-effective with insurance coverage. - Ordered MMR titer test to assess immunity status. - If titers show deficiency, will recommend MMR vaccination. - Ordered cholesterol panel as part of routine blood work. - Encouraged follow-up with dentist and eye doctor as needed.   Attention-deficit hyperactivity disorder ADHD managed with Adderall , effective. Elevated heart rate and blood pressure may be related to medication. - Continue current ADHD medication regimen per psychiatry  Elevated blood pressure  Blood pressure elevated at 140/90 mmHg. Possible contribution from ADHD medication. No immediate need for antihypertensive medication. Above goal today No evidence of end-organ damage on my exam Recommend patient monitor home blood pressure at least a few times weekly If home monitoring shows consistent elevation, or any symptom(s) develop, recommend reach out to us  for further advice on next steps  Insulin resistance Previously identified as prediabetic. Current blood work will assess A1c. - Ordered A1c test to assess current blood sugar control.      Lucie Buttner, PA-C [facility]         [1]  Social History Tobacco Use   Smoking status: Former    Current packs/day: 0.00    Average packs/day: 1.5 packs/day for 2.0 years (3.0 ttl pk-yrs)    Types: Cigarettes    Start date: 12/2009    Quit date: 12/2011    Years since quitting: 12.9   Smokeless tobacco: Never  Vaping Use   Vaping status: Never Used  Substance Use Topics   Alcohol use: Yes    Comment: rare   Drug use: Never   "

## 2024-12-11 LAB — MEASLES/MUMPS/RUBELLA IMMUNITY
Mumps IgG: 53 [AU]/ml
Rubella: 4.79 {index}
Rubeola IgG: >300 [AU]/ml

## 2024-12-12 ENCOUNTER — Ambulatory Visit: Payer: Self-pay | Admitting: Physician Assistant

## 2025-12-11 ENCOUNTER — Encounter: Admitting: Physician Assistant
